# Patient Record
Sex: Female | Born: 1980 | Race: Black or African American | Hispanic: No | Marital: Married | Smoking: Never smoker
Health system: Southern US, Community
[De-identification: ages and names within clinical notes are randomized; demographics above are authoritative.]

## PROBLEM LIST (undated history)

## (undated) HISTORY — PX: OTHER SURGICAL HISTORY: SHX169

---

## 2000-07-05 ENCOUNTER — Emergency Department (HOSPITAL_COMMUNITY): Admission: EM | Admit: 2000-07-05 | Discharge: 2000-07-05 | Payer: Self-pay | Admitting: Emergency Medicine

## 2000-07-05 ENCOUNTER — Encounter: Payer: Self-pay | Admitting: *Deleted

## 2000-08-10 ENCOUNTER — Inpatient Hospital Stay (HOSPITAL_COMMUNITY): Admission: AD | Admit: 2000-08-10 | Discharge: 2000-08-10 | Payer: Self-pay | Admitting: *Deleted

## 2000-09-01 ENCOUNTER — Inpatient Hospital Stay (HOSPITAL_COMMUNITY): Admission: AD | Admit: 2000-09-01 | Discharge: 2000-09-01 | Payer: Self-pay | Admitting: *Deleted

## 2000-09-10 ENCOUNTER — Encounter: Admission: RE | Admit: 2000-09-10 | Discharge: 2000-09-10 | Payer: Self-pay | Admitting: Family Medicine

## 2000-09-21 ENCOUNTER — Ambulatory Visit (HOSPITAL_COMMUNITY): Admission: RE | Admit: 2000-09-21 | Discharge: 2000-09-21 | Payer: Self-pay

## 2000-09-30 ENCOUNTER — Inpatient Hospital Stay (HOSPITAL_COMMUNITY): Admission: AD | Admit: 2000-09-30 | Discharge: 2000-09-30 | Payer: Self-pay | Admitting: Obstetrics

## 2000-10-04 ENCOUNTER — Encounter: Admission: RE | Admit: 2000-10-04 | Discharge: 2000-10-04 | Payer: Self-pay | Admitting: Family Medicine

## 2000-10-08 ENCOUNTER — Encounter: Admission: RE | Admit: 2000-10-08 | Discharge: 2000-10-08 | Payer: Self-pay | Admitting: Family Medicine

## 2000-10-10 ENCOUNTER — Encounter: Admission: RE | Admit: 2000-10-10 | Discharge: 2000-10-10 | Payer: Self-pay | Admitting: Family Medicine

## 2000-10-17 ENCOUNTER — Encounter: Admission: RE | Admit: 2000-10-17 | Discharge: 2000-10-17 | Payer: Self-pay | Admitting: Family Medicine

## 2000-10-18 ENCOUNTER — Encounter: Admission: RE | Admit: 2000-10-18 | Discharge: 2000-10-18 | Payer: Self-pay | Admitting: Family Medicine

## 2000-11-12 ENCOUNTER — Encounter: Admission: RE | Admit: 2000-11-12 | Discharge: 2000-11-12 | Payer: Self-pay | Admitting: Family Medicine

## 2000-11-27 ENCOUNTER — Encounter: Admission: RE | Admit: 2000-11-27 | Discharge: 2000-11-27 | Payer: Self-pay | Admitting: Sports Medicine

## 2000-11-29 ENCOUNTER — Inpatient Hospital Stay (HOSPITAL_COMMUNITY): Admission: AD | Admit: 2000-11-29 | Discharge: 2000-12-06 | Payer: Self-pay | Admitting: Obstetrics & Gynecology

## 2000-11-29 ENCOUNTER — Encounter: Payer: Self-pay | Admitting: Obstetrics & Gynecology

## 2000-11-30 ENCOUNTER — Encounter: Payer: Self-pay | Admitting: Obstetrics & Gynecology

## 2000-12-02 ENCOUNTER — Encounter: Payer: Self-pay | Admitting: Obstetrics & Gynecology

## 2000-12-13 ENCOUNTER — Encounter: Admission: RE | Admit: 2000-12-13 | Discharge: 2000-12-13 | Payer: Self-pay | Admitting: Family Medicine

## 2000-12-31 ENCOUNTER — Encounter: Admission: RE | Admit: 2000-12-31 | Discharge: 2000-12-31 | Payer: Self-pay | Admitting: Family Medicine

## 2001-01-06 ENCOUNTER — Inpatient Hospital Stay (HOSPITAL_COMMUNITY): Admission: AD | Admit: 2001-01-06 | Discharge: 2001-01-08 | Payer: Self-pay | Admitting: *Deleted

## 2001-01-10 ENCOUNTER — Encounter: Admission: RE | Admit: 2001-01-10 | Discharge: 2001-01-10 | Payer: Self-pay | Admitting: Family Medicine

## 2001-02-19 ENCOUNTER — Encounter: Admission: RE | Admit: 2001-02-19 | Discharge: 2001-02-19 | Payer: Self-pay | Admitting: Family Medicine

## 2001-02-19 ENCOUNTER — Other Ambulatory Visit: Admission: RE | Admit: 2001-02-19 | Discharge: 2001-02-19 | Payer: Self-pay | Admitting: Family Medicine

## 2002-07-24 ENCOUNTER — Encounter: Admission: RE | Admit: 2002-07-24 | Discharge: 2002-07-24 | Payer: Self-pay | Admitting: Family Medicine

## 2002-12-17 ENCOUNTER — Emergency Department (HOSPITAL_COMMUNITY): Admission: EM | Admit: 2002-12-17 | Discharge: 2002-12-17 | Payer: Self-pay | Admitting: Emergency Medicine

## 2002-12-19 ENCOUNTER — Encounter: Admission: RE | Admit: 2002-12-19 | Discharge: 2002-12-19 | Payer: Self-pay | Admitting: Family Medicine

## 2002-12-30 ENCOUNTER — Encounter: Admission: RE | Admit: 2002-12-30 | Discharge: 2002-12-30 | Payer: Self-pay | Admitting: Sports Medicine

## 2003-03-12 ENCOUNTER — Emergency Department (HOSPITAL_COMMUNITY): Admission: EM | Admit: 2003-03-12 | Discharge: 2003-03-13 | Payer: Self-pay | Admitting: *Deleted

## 2003-04-08 ENCOUNTER — Encounter (INDEPENDENT_AMBULATORY_CARE_PROVIDER_SITE_OTHER): Payer: Self-pay | Admitting: *Deleted

## 2003-04-08 LAB — CONVERTED CEMR LAB

## 2003-04-16 ENCOUNTER — Encounter: Admission: RE | Admit: 2003-04-16 | Discharge: 2003-04-16 | Payer: Self-pay | Admitting: Family Medicine

## 2003-06-26 ENCOUNTER — Encounter: Admission: RE | Admit: 2003-06-26 | Discharge: 2003-06-26 | Payer: Self-pay | Admitting: Family Medicine

## 2003-09-04 ENCOUNTER — Encounter (INDEPENDENT_AMBULATORY_CARE_PROVIDER_SITE_OTHER): Payer: Self-pay | Admitting: Specialist

## 2003-09-04 ENCOUNTER — Ambulatory Visit (HOSPITAL_COMMUNITY): Admission: RE | Admit: 2003-09-04 | Discharge: 2003-09-04 | Payer: Self-pay | Admitting: Obstetrics and Gynecology

## 2004-04-05 ENCOUNTER — Emergency Department (HOSPITAL_COMMUNITY): Admission: EM | Admit: 2004-04-05 | Discharge: 2004-04-05 | Payer: Self-pay | Admitting: Emergency Medicine

## 2005-05-16 ENCOUNTER — Ambulatory Visit: Payer: Self-pay | Admitting: Internal Medicine

## 2005-06-16 ENCOUNTER — Ambulatory Visit: Payer: Self-pay | Admitting: Internal Medicine

## 2005-06-30 ENCOUNTER — Encounter: Payer: Self-pay | Admitting: Family Medicine

## 2005-06-30 ENCOUNTER — Ambulatory Visit: Payer: Self-pay | Admitting: Family Medicine

## 2005-08-07 ENCOUNTER — Ambulatory Visit: Payer: Self-pay | Admitting: Family Medicine

## 2005-08-11 ENCOUNTER — Ambulatory Visit: Payer: Self-pay | Admitting: *Deleted

## 2005-10-31 ENCOUNTER — Ambulatory Visit: Payer: Self-pay | Admitting: Family Medicine

## 2005-11-07 ENCOUNTER — Ambulatory Visit (HOSPITAL_COMMUNITY): Admission: RE | Admit: 2005-11-07 | Discharge: 2005-11-07 | Payer: Self-pay | Admitting: Family Medicine

## 2005-12-21 ENCOUNTER — Emergency Department (HOSPITAL_COMMUNITY): Admission: EM | Admit: 2005-12-21 | Discharge: 2005-12-21 | Payer: Self-pay | Admitting: Emergency Medicine

## 2006-07-06 ENCOUNTER — Encounter (INDEPENDENT_AMBULATORY_CARE_PROVIDER_SITE_OTHER): Payer: Self-pay | Admitting: *Deleted

## 2006-08-23 ENCOUNTER — Ambulatory Visit (HOSPITAL_COMMUNITY): Admission: RE | Admit: 2006-08-23 | Discharge: 2006-08-23 | Payer: Self-pay | Admitting: Obstetrics and Gynecology

## 2006-12-20 ENCOUNTER — Emergency Department (HOSPITAL_COMMUNITY): Admission: EM | Admit: 2006-12-20 | Discharge: 2006-12-20 | Payer: Self-pay | Admitting: Emergency Medicine

## 2007-03-28 ENCOUNTER — Emergency Department (HOSPITAL_COMMUNITY): Admission: EM | Admit: 2007-03-28 | Discharge: 2007-03-28 | Payer: Self-pay | Admitting: Emergency Medicine

## 2007-11-17 ENCOUNTER — Inpatient Hospital Stay (HOSPITAL_COMMUNITY): Admission: AD | Admit: 2007-11-17 | Discharge: 2007-11-20 | Payer: Self-pay | Admitting: Obstetrics & Gynecology

## 2010-05-29 ENCOUNTER — Encounter: Payer: Self-pay | Admitting: Family Medicine

## 2010-05-29 ENCOUNTER — Encounter: Payer: Self-pay | Admitting: Obstetrics and Gynecology

## 2010-09-20 NOTE — Op Note (Signed)
April Roberts, April Roberts             ACCOUNT NO.:  [id number]   MEDICAL RECORD NO.:  [id number]          PATIENT TYPE:  INP   LOCATION:  9123                          FACILITY:  [facility]   PHYSICIAN:  Gerrit Friends. Aldona Bar, M.D.   DATE OF BIRTH:  [birth year]   DATE OF PROCEDURE:  DATE OF DISCHARGE:                               OPERATIVE REPORT   PREOPERATIVE DIAGNOSES:  1. Term pregnancy.  2. Vaginal birth after cesarean attempt, failed vaginal birth after      cesarean attempt.   POSTOPERATIVE DIAGNOSES:  1. Term pregnancy.  2. Vaginal birth after cesarean attempt, failed vaginal birth after      cesarean attempt plus delivery of 8 pounds 3 ounces female infant,      Apgars 8 and 9.   PROCEDURE:  Low-transverse cesarean section.   SURGEON:  Gerrit Friends. Aldona Bar, MD   ANESTHESIA:  Augmented epidural.   HISTORY:  This is a 30 year old gravida 4, para 2 who delivered of first  child by cesarean section because of failure to progress (8 pounds 5  ounces in 1999) and subsequently had a VBAC with a second delivery in  2002 and delivered a 6 pounds 5 ounces baby.  She had been followed the  office and was desirous of the VBAC.  Apparently, 1 week ago in the  office an ultrasound was done, I assumed because of size greater than  dates and the patient told me that the ultrasounds at the baby weighed  about 7 pounds.   She presented at 5 cm dilatation with a vertex at -2 station on the  morning of November 17, 2007, in early active labor.  She underwent  amniotomy with production of clear fluid.  An IUPC was placed and  because of poor labor.  She was augmented with Pitocin.  It is  noteworthy she measured 43 cm upon admission.   The patient progressed slowly but steadily to full dilatation, but after  pushing for a good hour, the vertex was at best at +2 station.  The  fetal heart rate remained reassuring.  Because of the concern about the  size of the baby in spite of what the ultrasounds stated in the  office.  I was somewhat reluctant assume to try a vacuum extraction assisted  delivery and after discussion with the patient and husband decision was  made to proceed to cesarean section for delivery.  The essential  diagnosis at this time was arrest disorder of the second stage of labor.   PROCEDURE:  Once in the operating room, the epidural was augmented.  After the patient was appropriately draped having been prepped and good  anesthetic levels were documented procedure was begun.  A Pfannenstiel  incision was made and with minimal difficulty dissected down sharply to  and through the fascia in a low transverse fashion with hemostasis  created each layer.  Subfascial space was created inferiorly and  superiorly, muscles separated in the midline.  Peritoneum was identified  and appropriate care taken to avoid the bowel superiorly and the bladder  inferiorly.  At this time, the vesicouterine  peritoneum was incised in a  low-transverse fashion and pushed off the lower uterine segment with  ease.  Sharp incisions into the lower segment with the Metzenbaum  scissors was then carried out in low-transverse fashion and extended  laterally with the fingers.  At this time, the vertex as one could  imagine was well into the pelvis.  But with persistence, it was possible  to elevate the vertex.  Thereafter, using a vacuum extractor delivery  was effected from vertex position.  The infant cried shortly after  delivery and after cord was clamped and cut, the infant was passed off  to the team.  Apgars were noted to be 8 and 9 and subsequent weight was  found to be 8 pounds 3 ounces - infant was female.   The infant was subsequently taken to nursery in good condition.  At this  time, placenta had been delivered and intact after cord bloods were  collected.  Uterus was exteriorized rendered free of any remaining  products conception and good uterine contractility was afforded with  slowly given  intravenous Pitocin and manual stimulation.  This time  closure of the uterine incision was carried out using a single layer of  #1 Vicryl in a running locking fashion.  This was oversewn with several  figure-of-eight #1 Vicryls.  Hemostasis was adequate.  The bladder flap  was reapproximated with 3-0 Vicryl in a running fashion.  At this time,  the uterine incision was noted to be hemostatic.  Tubes and ovaries were  inspected noted to be normal.  With all counts being correct and no  foreign bodies noted to be remaining in the abdominal cavity.  The  abdomen lavaged of all free blood and clot and uterus was replaced back  into the abdominal cavity.  At this time, closure of the abdomen was  carried out in layers.  The abdominal peritoneum was closed with 0  Vicryl in a running fashion and muscle secured with same.  Assured of  good fascial hemostasis, the fascia was then reapproximated using 0  Vicryl from angle to midline bilaterally.  Subcutaneous tissues were  rendered hemostatic and staples were then used to close the skin.  Sterile pressure dressing was applied.   At this time, the patient was transported to recovery room in  satisfactory condition, having tolerated well.  Estimated blood loss 500  mL.  All counts were correct x2.  The urine which did appear somewhat  bloody shortly after delivery, probably secondary to the elevation out  of the pelvis of the vertex and cleared prior to the procedure being  over.  At the conclusion of the procedure, both mother and baby were  doing well in the respective recovery areas.  All counts were correct  x2.      Gerrit Friends. Aldona Bar, M.D.  Electronically Signed     RMW/MEDQ  D:  11/17/2007  T:  11/17/2007  Job:  [id number]

## 2010-09-23 NOTE — H&P (Signed)
NAME:  April Roberts, ABTS                      ACCOUNT NO.:  [id number]   MEDICAL RECORD NO.:  [id number]                   PATIENT TYPE:  AMB   LOCATION:  SDC                                  [facility]:  [facility]   PHYSICIAN:  Naima A. Dillard, M.D.              DATE OF BIRTH:  04-11-1981   DATE OF ADMISSION:  DATE OF DISCHARGE:                                HISTORY & PHYSICAL   CHIEF COMPLAINT:  Right Bartholin's cyst and enlarged right labia minora.   HISTORY OF PRESENT ILLNESS:  The patient is a 30 year old African-American  gravida 3, para 2 who presented to me on August 21, 2003 complaining of a  cyst on her right labia, which was diagnosed as a Bartholin's. It has been  bothering her for 11 years. This cyst has been drained more than 20 times.  The patient reports that the cyst is very painful and interferes with  intercourse. She has never had a marsupialization or attempt at removal of  the Bartholin's gland and patient desires to have marsupialization and  revision of the right labia minora, which was also enlarged.   PAST MEDICAL HISTORY:  Unremarkable.   PAST OBSTETRIC HISTORY:  Significant for a cesarean section x1 and a VBAC  x1.   MEDICATIONS:  None.   ALLERGIES:  No known drug allergies.   PAST SURGICAL HISTORY:  Significant for cesarean section secondary to  failure to progress and wisdom teeth to be removed.   PAST GYNECOLOGIC HISTORY:  The patient had menarche at age 10, occurring once  a month, lasting for 5 days. She is currently using the [place] for birth  control. Denies any history of an abnormal pap smear or sexually transmitted  diseases.   SOCIAL HISTORY:  Negative for alcohol, tobacco, or illicit drug use.   REVIEW OF SYSTEMS:  ENDOCRINE:  Unremarkable.  GENITOURINARY:  As above.  CARDIOVASCULAR:  Unremarkable.  RESPIRATORY:  Unremarkable.  GASTROINTESTINAL:  Unremarkable.  MUSCULOSKELETAL:  Unremarkable.   PHYSICAL EXAMINATION:  VITAL SIGNS:  Weight  191 pounds. Blood pressure  110/80.  HEENT:  Pupils are equal. Hearing is normal.  NECK:  Throat is clear. Thyroid is not enlarged.  HEART:  Regular rate and rhythm.  LUNGS:  Clear to auscultation bilaterally.  BREASTS:  No masses, discharge, skin changes, or nipple retraction.  BACK:  No CVA tenderness.  ABDOMEN:  Nontender without any masses or organomegaly.  EXTREMITIES:  No clubbing, cyanosis, or edema bilaterally.  NEUROLOGIC:  Examination is within normal limits.  PELVIC:  Vaginal examination is within normal limits. On vulvar examination,  the patient has a right large Bartholin's cyst and a right labia minora  cyst. No fluctuance with mild discomfort. Cervix is normal size, shape, and  consistency. The IUD string is visible. Adnexa has no masses, and nontender.  RECTO/VAGINAL:  Examination was deferred.   ASSESSMENT:  Right Bartholin's cyst with tracks into the right labia minora  causing hypertrophic labia minora.   PLAN:  The patient has had multiple drainage of the cyst and desires  marsupialization and removal of fistulous tracks of the right labia minora  with a right labia minora revision. The patient was told that she had the  options of observation and doing nothing, versus marsupialization with  removal of fistulous track and right labia minora revision versus evaluation  and second opinion at [facility]. The patient desires surgery. The risks  are, but not limited to, bleeding, infection, future dyspareunia, poor  healing, and a need for second surgery. The patient was told that this may  not cure the problem and that the problem could return and she may need more  surgery. The patient agrees with the plan of care and desires to proceed  with surgery.                                               Naima A. Normand Sloop, M.D.    NAD/MEDQ  D:  09/03/2003  T:  09/03/2003  Job:  [id number]

## 2010-09-23 NOTE — Discharge Summary (Signed)
[facility]  Patient:    April Roberts, April Roberts                   MRN: [id number] Adm. Date:  [id number] Disc. Date: 12/06/00 Attending:  Antionette Char Dictator:   Emelda Fear, M.D.                           Discharge Summary  DATE OF BIRTH:                May 25, 1980  ADMISSION DIAGNOSES:          Pyelonephritis.  DISCHARGE DIAGNOSES:          Pyelonephritis.  HOSPITAL COURSE:              Claudetta is a 30 year old female G2, P1-0-0-1 presenting at 80 4/7 weeks to the [facility] secondary to fevers, and chills.  Upon presentation patient noted that she had experienced dysuria and hematuria in addition to the fever and chills.  Urinalysis upon presentation revealed trace leukocyte esterase negative for urine nitrites, 7-10 wbcs with many bacteria present.  Urine culture was positive for Enterococcus species and group B strep.  Enterococcus species was sensitive to ampicillin so patient received a seven day course of ampicillin IV while in-house.  White count on admission was 10.7, hemoglobin 11.6, hematocrit 32.7, platelet count 189,000, neutrophils 93%, lymphocytes 4%.  Blood culture was negative. Patient also received a renal ultrasound during admission that demonstrated minimal to moderate hydronephrosis on the left, however, within normal limits for the third trimester, and moderate hydronephrosis on the right kidney. Chest x-ray during admission was within normal limits and no active disease was detected.  Obstetrical ultrasound was also obtained during admission which revealed a single living intrauterine fetus in cephalic presentation with appropriate interval growth and amniotic fluid.  Patient was also managed while in-house on a pain management regimen with Darvocet and Percocet p.r.n. On the day of admission patient was also encouraged for an HIV test which was ordered and currently pending.  Upon day of discharge patient was  denying any costovertebral angle tenderness at this time and was tolerating p.o. intake. Patient was also afebrile on the day of discharge.  CONDITION ON DISCHARGE:       Stable.  DISPOSITION:                  Patient is discharged home.  DISCHARGE MEDICATIONS:        Amoxicillin 500 mg p.o. t.i.d.  ACTIVITY:                     Patient can resume normal activity.  DIET:                         Patient can resume normal diet.  WOUND CARE:                   Not applicable.  SPECIAL INSTRUCTIONS:         Patient is recommended to call Dr. ______, her primary care physician, with the development of fever, recurrence of costovertebral angle tenderness, or dysuria.  Patient will be following up at the [facility] on Thursday, August 8 at 1:30. DD:  12/06/00 TD:  12/07/00 Job: [id number]

## 2010-09-23 NOTE — Discharge Summary (Signed)
April Roberts, FAIR NO.:  [id number]   MEDICAL RECORD NO.:  [id number]          PATIENT TYPE:  INP   LOCATION:  9123                          FACILITY:  [facility]   PHYSICIAN:  Malva Limes, M.D.    DATE OF BIRTH:  Nov 22, 1980   DATE OF ADMISSION:  11/17/2007  DATE OF DISCHARGE:  11/20/2007                               DISCHARGE SUMMARY   FINAL DIAGNOSES:  Intrauterine pregnancy at term, trial of vaginal birth  after cesarean, failed attempt.   PROCEDURE:  Repeat low transverse cesarean section.   SURGEON:  Gerrit Friends. Aldona Bar, MD.   COMPLICATIONS:  None.   This 30 year old G4, P 2-0-1-2 presents at term desiring VBAC.  The  patient had deliver her first child by cesarean section in 1999  secondary to failure to progress and desires to try a VBAC with this  pregnancy.  The patient has been counseled in the office and has signed  consent forms as well.  An ultrasound was performed in the office about  a week prior showing an estimated fetal weight of about 7 pounds.  The  patient's antepartum course up to this point had been uncomplicated.  She was treated like a gestational diabetic because she was unable to  tolerate her Glucola sugar test, 1 hour screen, and did have a history  of gestational diabetes with her last pregnancy.  She continued to  monitor her blood sugars which were all within normal limits.  She did  have a negative group B strep culture at 35 weeks.  The patient was  admitted to the hospital on November 17, 2007, in active labor.  Amniotomy  was performed with clear fluids and Pitocin was begun to augment her  labor.  The patient dilated to complete and complete, but after pushing  for about a good hour, the vertex was still at +2 station.  Discussion  was held with the patient at this point and a decision was made to  proceed with the cesarean section at this time secondary to an arrest  disorder at the second stage of labor.  The patient was taken to  the  operating room by Dr. Annamaria Helling on November 17, 2007, where a repeat low  transverse cesarean section was performed with the delivery of an 8  pounds 3 ounces female infant with Apgars of 8 and 9.  Delivery went  without complications.  The patient's postoperative course was benign  without any significant fevers.  The patient was felt ready for  discharge on postoperative day #3.  She was sent home on a regular diet,  told to decrease activities, told to continue her prenatal vitamins, was  given Percocet 1 to 2 every 4 to 6 hours as needed for her pain, told  she could use an over-the-counter stool softener was to follow up in our  office in 4 weeks.  Instructions and precautions were reviewed with the  patient.  Labs on discharge, the patient had a hemoglobin of 11.6, white  blood cell count of 12.7, and platelets of 188,000.      April Roberts,  P.A.-C.      ______________________________  Malva Limes, M.D.   MB/MEDQ  D:  12/17/2007  T:  12/18/2007  Job:  [id number]

## 2010-09-23 NOTE — Op Note (Signed)
NAME:  April Roberts, April Roberts                      ACCOUNT NO.:  [id number]   MEDICAL RECORD NO.:  [id number]                   PATIENT TYPE:  AMB   LOCATION:  SDC                                  [facility]   PHYSICIAN:  Naima A. Dillard, M.D.              DATE OF BIRTH:  1981/03/16   DATE OF PROCEDURE:  09/04/2003  DATE OF DISCHARGE:                                 OPERATIVE REPORT   PREOPERATIVE DIAGNOSIS:  Right Bartholin's cyst and right labia minora cyst.   POSTOPERATIVE DIAGNOSIS:  Right Bartholin's cyst and right labia minora  cyst.   PROCEDURE:  Marsupialization of right Bartholin's and right labia minora  cyst with right labium minorum revision.   SURGEON:  Naima A. Normand Sloop, M.D.   ANESTHESIA:  General laryngeal mask airway.   ESTIMATED BLOOD LOSS:  About 300 mL.   URINE OUTPUT:  100 mL clear urine.   COMPLICATIONS:  None.   FINDINGS:  Right Bartholin's gland cyst and about a 1 cm right labia minora  cyst and hypertrophic right labium minorum.  The patient went to the  recovery room in stable condition.   DESCRIPTION OF PROCEDURE:  The patient was taken to the operating room where  she was given general anesthesia with laryngeal mask airway and placed in  the dorsal lithotomy position, prepped and draped in the normal sterile  fashion.  The right labium minorum was grasped with Allis clamps and 10 mL  1% lidocaine was placed in this area of the Bartholin's cyst gland.  Vertical incision was made with a knife just over the right Bartholin's cyst  and this area was marsupialized using 3-0 chromic.  Attention was then  turned to the right labium minorum.  A marking pen was used to mark the area  of the right labia minora that was going to be removed.  The knife was then  used to remove the hypertrophic excess right labia minora with the cyst.  There was no track between the right Bartholin's cyst and the right minora  cyst, however, we did see a portion of the cyst  wall left behind in the  right labia minora, so the right labia minora was made hemostatic with 3-0  figure-of-eight stitches and the cyst wall that was noted was also  marsupialized.  Hemostasis was noted.  A piece of Gelfoam and 4 inch packing  was placed into the marsupialization of the Bartholin's cyst.  Hemostasis  was noted.  Sponge, lap and needle counts correct x 2.  The patient taken to  the recovery room in stable condition.                                               Naima A. Normand Sloop, M.D.    NAD/MEDQ  D:  09/04/2003  T:  09/04/2003  Job:  [id number]

## 2011-02-02 LAB — CBC
HCT: 38.4
Hemoglobin: 13.1
MCV: 90.5
MCV: 92.2
Platelets: 229
RBC: 3.69 — ABNORMAL LOW
WBC: 12.7 — ABNORMAL HIGH
WBC: 5.9

## 2011-02-14 LAB — GC/CHLAMYDIA PROBE AMP, GENITAL: Chlamydia, DNA Probe: NEGATIVE

## 2011-02-14 LAB — CBC
HCT: 34.9 — ABNORMAL LOW
Hemoglobin: 12.1
MCHC: 34.7
MCV: 89
Platelets: 277
RBC: 3.92
RDW: 13.1
WBC: 5

## 2011-02-14 LAB — URINALYSIS, ROUTINE W REFLEX MICROSCOPIC
Bilirubin Urine: NEGATIVE
Glucose, UA: NEGATIVE
Hgb urine dipstick: NEGATIVE
Ketones, ur: NEGATIVE
Nitrite: POSITIVE — AB
Protein, ur: NEGATIVE
Specific Gravity, Urine: 1.019
Urobilinogen, UA: 1
pH: 8

## 2011-02-14 LAB — SYPHILIS: RPR W/REFLEX TO RPR TITER AND TREPONEMAL ANTIBODIES, TRADITIONAL SCREENING AND DIAGNOSIS ALGORITHM: RPR Ser Ql: NONREACTIVE

## 2011-02-14 LAB — DIFFERENTIAL
Basophils Absolute: 0
Basophils Relative: 1
Eosinophils Absolute: 0.1 — ABNORMAL LOW
Eosinophils Relative: 1
Lymphocytes Relative: 25
Lymphs Abs: 1.2
Monocytes Absolute: 0.3
Monocytes Relative: 6
Neutro Abs: 3.4
Neutrophils Relative %: 67

## 2011-02-14 LAB — WET PREP, GENITAL
Trich, Wet Prep: NONE SEEN
Yeast Wet Prep HPF POC: NONE SEEN

## 2011-02-14 LAB — URINE MICROSCOPIC-ADD ON

## 2011-02-14 LAB — ABO/RH: ABO/RH(D): O POS

## 2011-02-14 LAB — HCG, QUANTITATIVE, PREGNANCY

## 2011-02-20 LAB — URINALYSIS, ROUTINE W REFLEX MICROSCOPIC
Nitrite: POSITIVE — AB
Protein, ur: 100 — AB
pH: 6

## 2011-02-20 LAB — URINE CULTURE

## 2011-02-20 LAB — URINE MICROSCOPIC-ADD ON

## 2011-12-07 ENCOUNTER — Emergency Department (HOSPITAL_COMMUNITY): Payer: Managed Care, Other (non HMO)

## 2011-12-07 ENCOUNTER — Emergency Department (HOSPITAL_COMMUNITY)
Admission: EM | Admit: 2011-12-07 | Discharge: 2011-12-07 | Disposition: A | Discharge status: Home / Self Care | Payer: Managed Care, Other (non HMO) | Attending: Emergency Medicine | Admitting: Emergency Medicine

## 2011-12-07 ENCOUNTER — Encounter (HOSPITAL_COMMUNITY): Payer: Self-pay

## 2011-12-07 DIAGNOSIS — R079 Chest pain, unspecified: Secondary | ICD-10-CM | POA: Insufficient documentation

## 2011-12-07 DIAGNOSIS — R799 Abnormal finding of blood chemistry, unspecified: Secondary | ICD-10-CM | POA: Insufficient documentation

## 2011-12-07 LAB — CBC
Hemoglobin: 12.2 g/dL (ref 12.0–15.0)
MCH: 30.6 pg (ref 26.0–34.0)
MCHC: 34.6 g/dL (ref 30.0–36.0)
MCV: 88.5 fL (ref 78.0–100.0)
RBC: 3.99 MIL/uL (ref 3.87–5.11)

## 2011-12-07 LAB — BASIC METABOLIC PANEL
CO2: 26 mEq/L (ref 19–32)
Calcium: 8.9 mg/dL (ref 8.4–10.5)
Creatinine, Ser: 0.87 mg/dL (ref 0.50–1.10)
GFR calc non Af Amer: 87 mL/min — ABNORMAL LOW (ref >90)
Glucose, Bld: 91 mg/dL (ref 70–99)

## 2011-12-07 LAB — URINALYSIS, ROUTINE W REFLEX MICROSCOPIC
Bilirubin Urine: NEGATIVE
Glucose, UA: NEGATIVE mg/dL
Hgb urine dipstick: NEGATIVE
Specific Gravity, Urine: 1.04 — ABNORMAL HIGH (ref 1.005–1.030)
pH: 7 (ref 5.0–8.0)

## 2011-12-07 LAB — POCT PREGNANCY, URINE: Preg Test, Ur: NEGATIVE

## 2011-12-07 MED ORDER — IOHEXOL 350 MG/ML SOLN
65.0000 mL | Freq: Once | INTRAVENOUS | Status: AC | PRN
  Administered 2011-12-07: 65 mL via INTRAVENOUS

## 2011-12-07 MED ORDER — SODIUM CHLORIDE 0.9 % IV SOLN: Freq: Once | INTRAVENOUS | Status: DC

## 2011-12-07 NOTE — ED Notes (Signed)
C/o sudden onset 1030 while at rest midsternal CP non radiating & SOB which lasted 20 min. Upon subsiding pain turned into pressure/heaviness across upper chest. Denies fever, cold, cough, n/v, diaphoresis.  Pain increases with palpation & deep breaths

## 2011-12-07 NOTE — ED Provider Notes (Cosign Needed)
History     CSN: [id number]  Arrival date & time 12/07/11  1144   First MD Initiated Contact with Patient 12/07/11 1233      Chief Complaint  Patient presents with  . Chest Pain    (Consider location/radiation/quality/duration/timing/severity/associated sxs/prior treatment) HPI Comments: There is a 31 year old woman who had acute onset of chest pain at 10:30 AM. It felt like a stomach cramp in her chest. The pain lasted 15 or 20 minutes. The pain did not radiate. She took no medications for it. She had no prior similar episode. She therefore sought evaluation.  Patient is a 31 y.o. female presenting with chest pain. The history is provided by the patient. No language interpreter was used.  Chest Pain The chest pain began 3 - 5 hours ago. Duration of episode(s) is 20 minutes. Episode frequency: One episode. The chest pain is resolved. Associated with: Nothing. At its most intense, the pain is at 4/10. The pain is currently at 0/10. The severity of the pain is moderate. Quality: Cramping. The pain does not radiate. Exacerbated by: Nothing. Pertinent negatives for primary symptoms include no fever. Associated symptoms comments: No recent immobilization, no history of malignancy, no recent travel.  Not on birth control pills.. She tried nothing for the symptoms.     History reviewed. No pertinent past medical history.  Past Surgical History  Procedure Date  . Labial cyst     No family history on file.  History  Substance Use Topics  . Smoking status: Never Smoker   . Smokeless tobacco: Not on file  . Alcohol Use: No    OB History    Grav Para Term Preterm Abortions TAB SAB Ect Mult Living                  Review of Systems  Constitutional: Negative.  Negative for fever and chills.  HENT: Negative.   Eyes: Negative.   Respiratory: Negative.   Cardiovascular: Positive for chest pain.  Genitourinary:       Her last period lasted 8 days, long for her.  It finished 2 days  ago.  Musculoskeletal: Negative.   Skin: Negative.   Neurological: Negative.   Psychiatric/Behavioral: Negative.     Allergies  Review of patient's allergies indicates no known allergies.  Home Medications  No current outpatient prescriptions on file.  BP 115/76  Pulse 60  Temp 98.3 F (36.8 C) (Oral)  Resp 16  Ht 5\' 3"  (1.6 m)  Wt 204 lb (92.534 kg)  BMI 36.14 kg/m2  SpO2 100%  LMP 12/05/2011  Physical Exam  Nursing note and vitals reviewed. Constitutional: She is oriented to person, place, and time. She appears well-developed and well-nourished. No distress.  HENT:  Head: Normocephalic and atraumatic.  Right Ear: External ear normal.  Left Ear: External ear normal.  Mouth/Throat: Oropharynx is clear and moist.  Eyes: Conjunctivae and EOM are normal. Pupils are equal, round, and reactive to light. No scleral icterus.  Neck: Normal range of motion. Neck supple.  Cardiovascular: Normal rate, regular rhythm and normal heart sounds.   Pulmonary/Chest: Effort normal and breath sounds normal. She exhibits no tenderness.  Abdominal: Soft. Bowel sounds are normal.  Musculoskeletal: Normal range of motion. She exhibits no edema and no tenderness.  Lymphadenopathy:    She has no cervical adenopathy.  Neurological: She is alert and oriented to person, place, and time.       No sensory or motor deficit.  Skin: Skin is warm and  dry.  Psychiatric: She has a normal mood and affect. Her behavior is normal.    ED Course  Procedures (including critical care time)  Labs Reviewed  CBC - Abnormal; Notable for the following:    WBC 3.6 (*)     HCT 35.3 (*)     All other components within normal limits  BASIC METABOLIC PANEL  D-DIMER, QUANTITATIVE   12:34 PM  Date: 12/07/2011  Rate: 70  Rhythm: normal sinus rhythm  QRS Axis: normal  Intervals: normal  ST/T Wave abnormalities: normal  Conduction Disutrbances:none  Narrative Interpretation: Normal EKG  Old EKG Reviewed: none  available  1:44 PM . Results for orders placed during the hospital encounter of 12/07/11  CBC      Component Value Range   WBC 3.6 (*) 4.0 - 10.5 K/uL   RBC 3.99  3.87 - 5.11 MIL/uL   Hemoglobin 12.2  12.0 - 15.0 g/dL   HCT 16.1 (*) 09.6 - 04.5 %   MCV 88.5  78.0 - 100.0 fL   MCH 30.6  26.0 - 34.0 pg   MCHC 34.6  30.0 - 36.0 g/dL   RDW 40.9  81.1 - 91.4 %   Platelets 234  150 - 400 K/uL  BASIC METABOLIC PANEL      Component Value Range   Sodium 137  135 - 145 mEq/L   Potassium 4.4  3.5 - 5.1 mEq/L   Chloride 101  96 - 112 mEq/L   CO2 26  19 - 32 mEq/L   Glucose, Bld 91  70 - 99 mg/dL   BUN 10  6 - 23 mg/dL   Creatinine, Ser 7.82  0.50 - 1.10 mg/dL   Calcium 8.9  8.4 - 95.6 mg/dL   GFR calc non Af Amer 87 (*) >90 mL/min   GFR calc Af Amer >90  >90 mL/min  D-DIMER, QUANTITATIVE      Component Value Range   D-Dimer, Quant 1.62 (*) 0.00 - 0.48 ug/mL-FEU  POCT I-STAT TROPONIN I      Component Value Range   Troponin i, poc 0.00  0.00 - 0.08 ng/mL   Comment 3            Dg Chest 2 View  12/07/2011  *RADIOLOGY REPORT*  Clinical Data: Chest pain  CHEST - 2 VIEW  Comparison:  None.  Findings:  The heart size and mediastinal contours are within normal limits.  Both lungs are clear.  The visualized skeletal structures are unremarkable.  IMPRESSION: No active cardiopulmonary disease.  Original Report Authenticated By: Judie Petit. Ruel Favors, M.D.    D-dimer elevated.  CT angio of chest ordered.  Other lab results reassuringly negative.   4:16 PM CT angio of chest negative.  Pt advised that all of her tests were normal.  No serious cause of pain was found.  Released.  Return if she has recurrence of pain, feels faint, or develops fever.  1. Nonspecific chest pain        Carleene Cooper III, MD 12/07/11 1620

## 2011-12-07 NOTE — ED Notes (Signed)
Patient transported to X-ray 

## 2011-12-07 NOTE — ED Notes (Signed)
Patient transported to CT 

## 2011-12-07 NOTE — ED Notes (Signed)
Pt presents with sudden onset of mid-sternal chest pain this morning that she described "like stomach cramps only in my chest".  Pt reports that cramping is resolved, now feels a tightness to chest that radiates through to mid-scapula.  +shortness of breath.

## 2013-03-28 ENCOUNTER — Other Ambulatory Visit (HOSPITAL_COMMUNITY)
Admission: RE | Admit: 2013-03-28 | Discharge: 2013-03-28 | Disposition: A | Discharge status: Home / Self Care | Payer: Managed Care, Other (non HMO) | Source: Ambulatory Visit | Attending: Family | Admitting: Family

## 2013-03-28 ENCOUNTER — Other Ambulatory Visit: Payer: Self-pay

## 2013-03-28 DIAGNOSIS — Z113 Encounter for screening for infections with a predominantly sexual mode of transmission: Secondary | ICD-10-CM | POA: Insufficient documentation

## 2013-03-28 DIAGNOSIS — Z0000 Encounter for general adult medical examination without abnormal findings: Secondary | ICD-10-CM | POA: Insufficient documentation

## 2015-01-24 ENCOUNTER — Encounter (HOSPITAL_COMMUNITY): Payer: Self-pay | Admitting: Emergency Medicine

## 2015-01-24 ENCOUNTER — Emergency Department (HOSPITAL_COMMUNITY)
Admission: EM | Admit: 2015-01-24 | Discharge: 2015-01-24 | Disposition: A | Discharge status: Home / Self Care | Payer: BLUE CROSS/BLUE SHIELD | Attending: Emergency Medicine | Admitting: Emergency Medicine

## 2015-01-24 DIAGNOSIS — K088 Other specified disorders of teeth and supporting structures: Secondary | ICD-10-CM | POA: Diagnosis not present

## 2015-01-24 DIAGNOSIS — K0381 Cracked tooth: Secondary | ICD-10-CM | POA: Insufficient documentation

## 2015-01-24 DIAGNOSIS — K0889 Other specified disorders of teeth and supporting structures: Secondary | ICD-10-CM

## 2015-01-24 MED ORDER — HYDROCODONE-ACETAMINOPHEN 5-325 MG PO TABS
1.0000 | ORAL_TABLET | Freq: Four times a day (QID) | ORAL | Status: DC | PRN
Start: 2015-01-24 — End: 2015-08-25

## 2015-01-24 MED ORDER — PENICILLIN V POTASSIUM 500 MG PO TABS: 500.0000 mg | ORAL_TABLET | Freq: Three times a day (TID) | ORAL | Status: DC

## 2015-01-24 MED ORDER — IBUPROFEN 800 MG PO TABS: 800.0000 mg | ORAL_TABLET | Freq: Three times a day (TID) | ORAL | Status: DC

## 2015-01-24 MED ORDER — HYDROCODONE-ACETAMINOPHEN 5-325 MG PO TABS
1.0000 | ORAL_TABLET | Freq: Once | ORAL | Status: AC
  Administered 2015-01-24: 1 via ORAL
  Filled 2015-01-24: qty 1

## 2015-01-24 NOTE — Discharge Instructions (Signed)

## 2015-01-24 NOTE — ED Notes (Signed)
Pt reports cavity to R lower molar causing pain to jaw and into ear. Denies fevers.

## 2015-01-24 NOTE — ED Provider Notes (Signed)
CSN: [id number]     Arrival date & time 01/24/15  2135 History   First MD Initiated Contact with Patient 01/24/15 2155     Chief Complaint  Patient presents with  . Dental Pain     (Consider location/radiation/quality/duration/timing/severity/associated sxs/prior Treatment) HPI   34 year old female presents for evaluation of dental pain. Patient report having right lower tooth pain ongoing for nearly 2 weeks. Pain described as a throbbing sensation, worsening with talking and chewing. Pain initially was mild and intermittent but for the past several days has been persistent. She was unable to be seen by a dentist. She has been taking over-the-counter medication including ibuprofen, Motrin, Tylenol p.m without adequate improvement. She denies fever, hearing changes, throat swelling, neck stiffness, or rash. She is a nonsmoker. She denies any recent trauma.  History reviewed. No pertinent past medical history. Past Surgical History  Procedure Laterality Date  . Labial cyst     No family history on file. Social History  Substance Use Topics  . Smoking status: Never Smoker   . Smokeless tobacco: None  . Alcohol Use: No   OB History    No data available     Review of Systems  Constitutional: Negative for fever.  HENT: Positive for dental problem.       Allergies  Review of patient's allergies indicates no known allergies.  Home Medications   Prior to Admission medications   Not on File   BP 133/85 mmHg  Pulse 71  Temp(Src) 98.2 F (36.8 C) (Oral)  Resp 16  Ht  (1.575 m)  SpO2 97%  LMP 01/10/2015 Physical Exam  Constitutional: She appears well-developed and well-nourished. No distress.  HENT:  Head: Atraumatic.  Right Ear: External ear normal.  Left Ear: External ear normal.  Mouth/Throat: Oropharynx is clear and moist.  Mouth: Tenderness to tooth #31 on palpation with mild surrounding gingival erythema. Tooth has a 30% old avulsed fracture.  No obvious  abscess noted.  Eyes: Conjunctivae are normal.  Neck: Neck supple.  Lymphadenopathy:    She has no cervical adenopathy.  Neurological: She is alert.  Skin: No rash noted.  Psychiatric: She has a normal mood and affect.  Nursing note and vitals reviewed.   ED Course  Procedures (including critical care time)  Dental pain, no obvious abscess. Will prescribe antibiotic and pain medication. Patient will follow-up with dentist in the next 2 days for further care.   MDM   Final diagnoses:  Pain, dental   BP 133/85 mmHg  Pulse 71  Temp(Src) 98.2 F (36.8 C) (Oral)  Resp 16  Ht  (1.575 m)  SpO2 97%  LMP 01/10/2015     Fayrene Helper, PA-C 01/24/15 2212  Richardean Canal, MD 01/25/15 [phone]

## 2015-08-25 ENCOUNTER — Emergency Department (HOSPITAL_COMMUNITY)
Admission: EM | Admit: 2015-08-25 | Discharge: 2015-08-25 | Disposition: A | Discharge status: Home / Self Care | Payer: BLUE CROSS/BLUE SHIELD | Attending: Emergency Medicine | Admitting: Emergency Medicine

## 2015-08-25 ENCOUNTER — Encounter (HOSPITAL_COMMUNITY): Payer: Self-pay | Admitting: Emergency Medicine

## 2015-08-25 DIAGNOSIS — K047 Periapical abscess without sinus: Secondary | ICD-10-CM | POA: Insufficient documentation

## 2015-08-25 DIAGNOSIS — K029 Dental caries, unspecified: Secondary | ICD-10-CM | POA: Insufficient documentation

## 2015-08-25 DIAGNOSIS — K002 Abnormalities of size and form of teeth: Secondary | ICD-10-CM | POA: Insufficient documentation

## 2015-08-25 DIAGNOSIS — Z791 Long term (current) use of non-steroidal anti-inflammatories (NSAID): Secondary | ICD-10-CM | POA: Insufficient documentation

## 2015-08-25 MED ORDER — PENICILLIN V POTASSIUM 250 MG PO TABS
500.0000 mg | ORAL_TABLET | Freq: Once | ORAL | Status: AC
  Administered 2015-08-25: 500 mg via ORAL
  Filled 2015-08-25: qty 2

## 2015-08-25 MED ORDER — HYDROCODONE-ACETAMINOPHEN 5-325 MG PO TABS: ORAL_TABLET | ORAL | Status: DC

## 2015-08-25 MED ORDER — PENICILLIN V POTASSIUM 500 MG PO TABS: 500.0000 mg | ORAL_TABLET | Freq: Three times a day (TID) | ORAL | Status: AC

## 2015-08-25 NOTE — ED Provider Notes (Signed)
CSN: [id number]     Arrival date & time 08/25/15  1718 History   First MD Initiated Contact with Patient 08/25/15 1808     Chief Complaint  Patient presents with  . Dental Pain     (Consider location/radiation/quality/duration/timing/severity/associated sxs/prior Treatment) HPI Comments: Patient presents with complaint of left lower dental pain and swelling. Patient states that the pain started several days ago and the swelling started this morning. Pain is worse with palpation and opening of her mouth. She is not having any difficulty swallowing or breathing. No fever. Patient knows that she has teeth that need to be pulled in that area. She has taken Aleve with some relief. No history of diabetes or other immune compromising states. Onset of symptoms acute. Course is worsening.  The history is provided by the patient.    History reviewed. No pertinent past medical history. Past Surgical History  Procedure Laterality Date  . Labial cyst     History reviewed. No pertinent family history. Social History  Substance Use Topics  . Smoking status: Never Smoker   . Smokeless tobacco: None  . Alcohol Use: No   OB History    No data available     Review of Systems  Constitutional: Negative for fever.  HENT: Positive for dental problem and facial swelling. Negative for ear pain, sore throat and trouble swallowing.   Respiratory: Negative for shortness of breath and stridor.   Musculoskeletal: Negative for neck pain.  Skin: Negative for color change.  Neurological: Negative for headaches.      Allergies  Review of patient's allergies indicates no known allergies.  Home Medications   Prior to Admission medications   Medication Sig Start Date End Date Taking? Authorizing Provider  HYDROcodone-acetaminophen (NORCO/VICODIN) 5-325 MG tablet Take 1-2 tablets every 6 hours as needed for severe pain 08/25/15   Renne CriglerJoshua Quanasia Defino, PA-C  ibuprofen (ADVIL,MOTRIN) 800 MG tablet Take 1 tablet  (800 mg total) by mouth 3 (three) times daily. 01/24/15   Fayrene HelperBowie Tran, PA-C  penicillin v potassium (VEETID) 500 MG tablet Take 1 tablet (500 mg total) by mouth 3 (three) times daily. 08/25/15   Renne CriglerJoshua Gabino Hagin, PA-C   BP 104/81 mmHg  Pulse 73  Temp(Src) 98.3 F (36.8 C) (Oral)  Resp 18  SpO2 100%   Physical Exam  Constitutional: She appears well-developed and well-nourished.  HENT:  Head: Normocephalic and atraumatic.  Right Ear: Tympanic membrane, external ear and ear canal normal.  Left Ear: Tympanic membrane, external ear and ear canal normal.  Nose: Nose normal.  Mouth/Throat: Uvula is midline, oropharynx is clear and moist and mucous membranes are normal. No trismus in the jaw. Abnormal dentition. Dental caries present. No dental abscesses or uvula swelling. No tonsillar abscesses.  Patient with L mandibular tooth pain and tenderness to palpation in area of tooth #18. Patient does not have a definitive palpable abscess but likely has a forming abscess. Mild trismus.  Eyes: Conjunctivae are normal.  Neck: Normal range of motion. Neck supple.  No neck swelling or Ludwig's angina. Patient with full range of motion of neck without any difficulty in all 6 directions.  Lymphadenopathy:    She has no cervical adenopathy.  Neurological: She is alert.  Skin: Skin is warm and dry.  Psychiatric: She has a normal mood and affect.  Nursing note and vitals reviewed.   ED Course  Procedures (including critical care time)  6:22 PM Patient seen and examined. Medications ordered.   Vital signs reviewed and are  as follows: BP 104/81 mmHg  Pulse 73  Temp(Src) 98.3 F (36.8 C) (Oral)  Resp 18  SpO2 100%  Patient counseled on use of narcotic pain medications. Counseled not to combine these medications with others containing tylenol. Urged not to drink alcohol, drive, or perform any other activities that requires focus while taking these medications. The patient verbalizes understanding and  agrees with the plan.  Patient counseled to take prescribed medications as directed, return with worsening facial or neck swelling, and to follow-up with their dentist as soon as possible.    MDM   Final diagnoses:  Dental abscess   Patient with toothache with associated dental abscess, likely forming periapical abscess. No fever. Exam unconcerning for Ludwig's angina or other deep tissue infection in neck. I do not feel that imaging is indicated at this time. Patient counseled on return instructions for Ludwig's angina.  As there is gum swelling, erythema, and facial swelling, will treat with antibiotic and pain medicine. Urged patient to follow-up with dentist.        Renne Crigler, PA-C 08/25/15 1842  Benjiman Core, MD 08/26/15 [phone]

## 2015-08-25 NOTE — ED Notes (Signed)
Pt sts left sided dental pain and swelling starting this am

## 2015-08-25 NOTE — Discharge Instructions (Signed)
Please read and follow all provided instructions.  Your diagnoses today include:  1. Dental abscess    The exam and treatment you received today has been provided on an emergency basis only. This is not a substitute for complete medical or dental care.  Tests performed today include:  Vital signs. See below for your results today.   Medications prescribed:   Penicillin - antibiotic  You have been prescribed an antibiotic medicine: take the entire course of medicine even if you are feeling better. Stopping early can cause the antibiotic not to work.   Vicodin (hydrocodone/acetaminophen) - narcotic pain medication  DO NOT drive or perform any activities that require you to be awake and alert because this medicine can make you drowsy. BE VERY CAREFUL not to take multiple medicines containing Tylenol (also called acetaminophen). Doing so can lead to an overdose which can damage your liver and cause liver failure and possibly death.  Take any prescribed medications only as directed.  Home care instructions:  Follow any educational materials contained in this packet.  Follow-up instructions: Please follow-up with your dentist for further evaluation of your symptoms.   Dental Assistance: See below for dental referrals  Return instructions:   Please return to the Emergency Department if you experience worsening symptoms.  Please return if you develop a fever, you develop more swelling in your face or neck, you have trouble breathing or swallowing food.  Please return if you have any other emergent concerns.  Additional Information:  Your vital signs today were: BP 104/81 mmHg   Pulse 73   Temp(Src) 98.3 F (36.8 C) (Oral)   Resp 18   SpO2 100% If your blood pressure (BP) was elevated above 135/85 this visit, please have this repeated by your doctor within one month. --------------

## 2016-10-14 ENCOUNTER — Emergency Department (HOSPITAL_COMMUNITY): Payer: BLUE CROSS/BLUE SHIELD

## 2016-10-14 ENCOUNTER — Emergency Department (HOSPITAL_COMMUNITY)
Admission: EM | Admit: 2016-10-14 | Discharge: 2016-10-14 | Disposition: A | Discharge status: Home / Self Care | Payer: BLUE CROSS/BLUE SHIELD | Attending: Emergency Medicine | Admitting: Emergency Medicine

## 2016-10-14 ENCOUNTER — Encounter (HOSPITAL_COMMUNITY): Payer: Self-pay

## 2016-10-14 DIAGNOSIS — R109 Unspecified abdominal pain: Secondary | ICD-10-CM | POA: Insufficient documentation

## 2016-10-14 LAB — COMPREHENSIVE METABOLIC PANEL
ALBUMIN: 3.6 g/dL (ref 3.5–5.0)
ALK PHOS: 60 U/L (ref 38–126)
ALT: 15 U/L (ref 14–54)
AST: 19 U/L (ref 15–41)
Anion gap: 7 (ref 5–15)
BILIRUBIN TOTAL: 0.6 mg/dL (ref 0.3–1.2)
BUN: 7 mg/dL (ref 6–20)
CALCIUM: 9.2 mg/dL (ref 8.9–10.3)
CO2: 26 mmol/L (ref 22–32)
Chloride: 103 mmol/L (ref 101–111)
Creatinine, Ser: 0.93 mg/dL (ref 0.44–1.00)
GFR calc Af Amer: >60 mL/min (ref >60)
GLUCOSE: 120 mg/dL — AB (ref 65–99)
POTASSIUM: 3.9 mmol/L (ref 3.5–5.1)
Sodium: 136 mmol/L (ref 135–145)
TOTAL PROTEIN: 7 g/dL (ref 6.5–8.1)

## 2016-10-14 LAB — CBC
HEMATOCRIT: 36.1 % (ref 36.0–46.0)
Hemoglobin: 11.8 g/dL — ABNORMAL LOW (ref 12.0–15.0)
MCH: 29.4 pg (ref 26.0–34.0)
MCHC: 32.7 g/dL (ref 30.0–36.0)
MCV: 90 fL (ref 78.0–100.0)
Platelets: 307 10*3/uL (ref 150–400)
RBC: 4.01 MIL/uL (ref 3.87–5.11)
RDW: 13.1 % (ref 11.5–15.5)
WBC: 5.6 10*3/uL (ref 4.0–10.5)

## 2016-10-14 LAB — PREGNANCY, URINE: PREG TEST UR: NEGATIVE

## 2016-10-14 LAB — URINALYSIS, ROUTINE W REFLEX MICROSCOPIC
BILIRUBIN URINE: NEGATIVE
GLUCOSE, UA: NEGATIVE mg/dL
HGB URINE DIPSTICK: NEGATIVE
KETONES UR: NEGATIVE mg/dL
Leukocytes, UA: NEGATIVE
Nitrite: NEGATIVE
PROTEIN: NEGATIVE mg/dL
Specific Gravity, Urine: 1.02 (ref 1.005–1.030)
pH: 5 (ref 5.0–8.0)

## 2016-10-14 LAB — LIPASE, BLOOD: Lipase: 31 U/L (ref 11–51)

## 2016-10-14 MED ORDER — MELOXICAM 7.5 MG PO TABS: 7.5000 mg | ORAL_TABLET | Freq: Every day | ORAL | 0 refills | Status: DC

## 2016-10-14 MED ORDER — IOPAMIDOL (ISOVUE-300) INJECTION 61%
INTRAVENOUS | Status: AC
  Administered 2016-10-14: 100 mL
  Filled 2016-10-14: qty 100

## 2016-10-14 MED ORDER — CYCLOBENZAPRINE HCL 10 MG PO TABS: 10.0000 mg | ORAL_TABLET | Freq: Two times a day (BID) | ORAL | 0 refills | Status: DC | PRN

## 2016-10-14 NOTE — ED Notes (Signed)
Patient transported to CT 

## 2016-10-14 NOTE — ED Notes (Signed)
Pt aware of need for urine  

## 2016-10-14 NOTE — ED Provider Notes (Signed)
[facility]-EMERGENCY DEPT Provider Note   CSN: [id number] Arrival date & time: 10/14/16  0758     History   Chief Complaint Chief Complaint  Patient presents with  . Flank Pain    HPI April Roberts is a 36 y.o. female who presents to the emergency department with a chief complaint of left flank pain that began yesterday. She reports that the pain initially began as a constant sharp pain, but has not changed to a constant dull ache with intermittent sharpness. She reports the pain is aggravated with direct pressure and alleviated by nothing. No treatment PTA.  She denies fever, chills, nausea, vomiting, diarrhea, constipation, dysuria, urinary frequency or hesitancy, vaginal discharge or odor, hematuria. LMP was 09/30/16. She does not currently use birth control; she reports her husband underwent a vasectomy. No history of abdominal surgery.  The history is provided by the patient. No language interpreter was used.    History reviewed. No pertinent past medical history.  There are no active problems to display for this patient.   Past Surgical History:  Procedure Laterality Date  . labial cyst      OB History    No data available       Home Medications    Prior to Admission medications   Medication Sig Start Date End Date Taking? Authorizing Provider  cyclobenzaprine (FLEXERIL) 10 MG tablet Take 1 tablet (10 mg total) by mouth 2 (two) times daily as needed for muscle spasms. 10/14/16   Sueko Dimichele A, PA-C  HYDROcodone-acetaminophen (NORCO/VICODIN) 5-325 MG tablet Take 1-2 tablets every 6 hours as needed for severe pain Patient not taking: Reported on 10/14/2016 08/25/15   Renne Crigler, PA-C  ibuprofen (ADVIL,MOTRIN) 800 MG tablet Take 1 tablet (800 mg total) by mouth 3 (three) times daily. Patient not taking: Reported on 10/14/2016 01/24/15   Fayrene Helper, PA-C  meloxicam (MOBIC) 7.5 MG tablet Take 1 tablet (7.5 mg total) by mouth daily. 10/14/16   Mili Piltz A, PA-C  penicillin v  potassium (VEETID) 500 MG tablet Take 1 tablet (500 mg total) by mouth 3 (three) times daily. Patient not taking: Reported on 10/14/2016 08/25/15   Renne Crigler, PA-C    Family History No family history on file.  Social History Social History  Substance Use Topics  . Smoking status: Never Smoker  . Smokeless tobacco: Not on file  . Alcohol use No     Allergies   Patient has no known allergies.   Review of Systems Review of Systems  Constitutional: Negative for activity change, chills and fever.  Respiratory: Negative for shortness of breath.   Cardiovascular: Negative for chest pain.  Gastrointestinal: Negative for abdominal pain, diarrhea, nausea and vomiting.  Genitourinary: Positive for flank pain (left). Negative for dysuria and frequency.  Musculoskeletal: Negative for back pain.  Skin: Negative for rash.    Physical Exam Updated Vital Signs BP 123/78   Pulse 67   Temp 98.5 F (36.9 C) (Oral)   Resp 20   Ht 5\' 2"  (1.575 m)   Wt 104.3 kg (230 lb)   LMP 09/30/2016   SpO2 100%   BMI 42.07 kg/m   Physical Exam  Constitutional: No distress.  HENT:  Head: Normocephalic.  Eyes: Conjunctivae are normal.  Neck: Neck supple.  Cardiovascular: Normal rate, regular rhythm and normal heart sounds.  Exam reveals no gallop and no friction rub.   No murmur heard. Pulmonary/Chest: Effort normal. No respiratory distress.  Abdominal: Soft. She exhibits no distension. There is  tenderness.  Tenderness to palpation over the left upper and lower quadrants with suprapubic and epigastric tenderness. Slight guarding. No rebound tenderness noted. Normoactive bowel sounds auscultated in all 4 quadrants.  Neurological: She is alert.  Skin: Skin is warm. No rash noted.  Psychiatric: Her behavior is normal.  Nursing note and vitals reviewed.  ED Treatments / Results  Labs (all labs ordered are listed, but only abnormal results are displayed) Labs Reviewed  COMPREHENSIVE METABOLIC  PANEL - Abnormal; Notable for the following:       Result Value   Glucose, Bld 120 (*)    All other components within normal limits  CBC - Abnormal; Notable for the following:    Hemoglobin 11.8 (*)    All other components within normal limits  URINALYSIS, ROUTINE W REFLEX MICROSCOPIC  PREGNANCY, URINE  LIPASE, BLOOD    EKG  EKG Interpretation None       Radiology Ct Abdomen Pelvis W Contrast  Result Date: 10/14/2016 CLINICAL DATA:  36 year old female with left flank and abdominal pain for 1 day. EXAM: CT ABDOMEN AND PELVIS WITH CONTRAST TECHNIQUE: Multidetector CT imaging of the abdomen and pelvis was performed using the standard protocol following bolus administration of intravenous contrast. CONTRAST:  100mL ISOVUE-300 IOPAMIDOL (ISOVUE-300) INJECTION 61% COMPARISON:  12/20/2006 and prior CTs FINDINGS: Lower chest: No acute abnormality Hepatobiliary: The liver and gallbladder are unremarkable. There is no evidence of biliary dilatation. Pancreas: Unremarkable Spleen: Unremarkable Adrenals/Urinary Tract: The kidneys, adrenal glands and bladder are unremarkable. Stomach/Bowel: Stomach is within normal limits. Appendix appears normal. No evidence of bowel wall thickening, distention, or inflammatory changes. Vascular/Lymphatic: No significant vascular findings are present. No enlarged abdominal or pelvic lymph nodes. Reproductive: Uterus and bilateral adnexa are unremarkable. Other: No abdominal wall hernia or abnormality. No abdominopelvic ascites. Musculoskeletal: No acute or significant osseous findings. IMPRESSION: Unremarkable CT of the abdomen and pelvis with contrast. Electronically Signed   By: Harmon PierJeffrey  Hu M.D.   On: 10/14/2016 12:31    Procedures Procedures (including critical care time)  Medications Ordered in ED Medications  iopamidol (ISOVUE-300) 61 % injection (100 mLs  Contrast Given 10/14/16 1150)     Initial Impression / Assessment and Plan / ED Course  I have reviewed  the triage vital signs and the nursing notes.  Pertinent labs & imaging results that were available during my care of the patient were reviewed by me and considered in my medical decision making (see chart for details).     Patient is nontoxic, nonseptic appearing, in no apparent distress.  Patient's pain and other symptoms adequately managed in emergency department. Labs, imaging and vitals reviewed.  Patient does not meet the SIRS or Sepsis criteria.  On repeat exam patient does not have a surgical abdomen and there are no peritoneal signs. CT negative for acute pathology. No indication of appendicitis, bowel obstruction, bowel perforation, cholecystitis, diverticulitis, PID or ectopic pregnancy.  Patient discharged home with symptomatic treatment and given strict instructions for follow-up with their primary care physician.  I have also discussed reasons to return immediately to the ER.  Patient expresses understanding and agrees with plan.  Final Clinical Impressions(s) / ED Diagnoses   Final diagnoses:  Acute left flank pain    New Prescriptions Discharge Medication List as of 10/14/2016  1:06 PM    START taking these medications   Details  cyclobenzaprine (FLEXERIL) 10 MG tablet Take 1 tablet (10 mg total) by mouth 2 (two) times daily as needed for muscle spasms.,  Starting Sat 10/14/2016, Print    meloxicam (MOBIC) 7.5 MG tablet Take 1 tablet (7.5 mg total) by mouth daily., Starting Sat 10/14/2016, Print         Mahesh Sizemore A, PA-C 10/15/16 1653    Marily Memos, MD 10/16/16 [phone]

## 2016-10-14 NOTE — Discharge Instructions (Signed)
Please call and schedule follow up appointment with your primary care provider if your symptoms are not improving in 5-7 days. You can take meloxicam once daily with food to help with muscle pain and inflammation. Please do not use ibuprofen if you are taking this medication. You can also apply ice or heat as needed. You can also take Flexeril for muscle cramping and pain. Please use caution with this medication if you are going to work or driving because it can make you sleepy.

## 2016-10-14 NOTE — ED Triage Notes (Signed)
Pt c/o left flank pain onset yesterday. Pt denies any other symptoms.

## 2017-01-25 ENCOUNTER — Emergency Department (HOSPITAL_COMMUNITY)
Admission: EM | Admit: 2017-01-25 | Discharge: 2017-01-25 | Disposition: A | Discharge status: Home / Self Care | Payer: BLUE CROSS/BLUE SHIELD | Attending: Emergency Medicine | Admitting: Emergency Medicine

## 2017-01-25 ENCOUNTER — Encounter (HOSPITAL_COMMUNITY): Payer: Self-pay | Admitting: Emergency Medicine

## 2017-01-25 ENCOUNTER — Emergency Department (HOSPITAL_COMMUNITY): Payer: BLUE CROSS/BLUE SHIELD

## 2017-01-25 DIAGNOSIS — M1712 Unilateral primary osteoarthritis, left knee: Secondary | ICD-10-CM

## 2017-01-25 DIAGNOSIS — Z79899 Other long term (current) drug therapy: Secondary | ICD-10-CM | POA: Insufficient documentation

## 2017-01-25 DIAGNOSIS — M25562 Pain in left knee: Secondary | ICD-10-CM | POA: Diagnosis present

## 2017-01-25 MED ORDER — KETOROLAC TROMETHAMINE 30 MG/ML IJ SOLN
30.0000 mg | Freq: Once | INTRAMUSCULAR | Status: AC
  Administered 2017-01-25: 30 mg via INTRAMUSCULAR
  Filled 2017-01-25: qty 1

## 2017-01-25 MED ORDER — NAPROXEN 500 MG PO TABS: 500.0000 mg | ORAL_TABLET | Freq: Two times a day (BID) | ORAL | 0 refills | Status: DC

## 2017-01-25 NOTE — ED Triage Notes (Signed)
Pt presents to ED for assessment of left knee pain, with swelling and stiffness x 1 year.  Over the past 2 days she has had a clicking sensation and sharp pains when walking.  Pt denies injury.  Pt had home brace on knee upon arrival.

## 2017-01-25 NOTE — Discharge Instructions (Signed)
Please read and follow all provided instructions.  You have been seen today for left knee pain consistent with arthritis.  Tests performed today include: An x-ray of the affected area - does NOT show any broken bones or dislocations. There was noted arthritis.  Vital signs. See below for your results today.   Home care instructions: --  Follow attached information.  Use Naproxen as needed for pain. For additional pain, use Tylenol.   Follow-up instructions: Please follow-up with your primary care provider or the provided orthopedic physician (bone specialist) in 1 week. In this case you may have a more severe injury that requires further care.   Return instructions:  Please return if your toes or feet are numb or tingling, appear gray or blue, or you have severe pain (also elevate the leg and loosen splint or wrap if you were given one) Please return to the Emergency Department if you experience worsening symptoms.  Please return if you have any other emergent concerns. Additional Information:  Your vital signs today were: BP 135/90 (BP Location: Right Arm)    Pulse 69    Temp 99 F (37.2 C) (Oral)    Resp 18    Ht  (1.575 m)    Wt 104.3 kg (230 lb)    LMP 01/08/2017    SpO2 100%    BMI 42.07 kg/m  If your blood pressure (BP) was elevated above 135/85 this visit, please have this repeated by your doctor within one month. ---------------

## 2017-01-25 NOTE — ED Provider Notes (Signed)
[facility]-EMERGENCY DEPT Provider Note   CSN: [id number] Arrival date & time: 01/25/17  1753     History   Chief Complaint Chief Complaint  Patient presents with  . Knee Pain    HPI April Roberts is a 37 y.o. female who presents to the emergency department today for left knee pain 1 year. The patient states that over the last year she has had consistent left knee pain, that she describes as an aching pain stiffness that is worse in the morning and with weather changes. The patient notes that she has intermittent swelling after long periods of walking but this is usually relieved after periods of rest. Over the last 2 days she has had clicking, popping and sharp pains when walking on the lateral aspect of the knee. She denies trauma, or fall. The patient has been taking advil for this with mild-moderate relief. She has a knee brace she picked up from the drug store that she feels is helping. She denies fever, chills, erythema. No numbness.tigiling or weakness. No IVDU.   HPI  History reviewed. No pertinent past medical history.  There are no active problems to display for this patient.   Past Surgical History:  Procedure Laterality Date  . labial cyst      OB History    No data available       Home Medications    Prior to Admission medications   Medication Sig Start Date End Date Taking? Authorizing Provider  cyclobenzaprine (FLEXERIL) 10 MG tablet Take 1 tablet (10 mg total) by mouth 2 (two) times daily as needed for muscle spasms. 10/14/16   McDonald, Mia A, PA-C  HYDROcodone-acetaminophen (NORCO/VICODIN) 5-325 MG tablet Take 1-2 tablets every 6 hours as needed for severe pain Patient not taking: Reported on 10/14/2016 08/25/15   Renne Crigler, PA-C  ibuprofen (ADVIL,MOTRIN) 800 MG tablet Take 1 tablet (800 mg total) by mouth 3 (three) times daily. Patient not taking: Reported on 10/14/2016 01/24/15   Fayrene Helper, PA-C  meloxicam (MOBIC) 7.5 MG tablet Take 1 tablet (7.5 mg total)  by mouth daily. 10/14/16   McDonald, Mia A, PA-C  penicillin v potassium (VEETID) 500 MG tablet Take 1 tablet (500 mg total) by mouth 3 (three) times daily. Patient not taking: Reported on 10/14/2016 08/25/15   Renne Crigler, PA-C    Family History History reviewed. No pertinent family history.  Social History Social History  Substance Use Topics  . Smoking status: Never Smoker  . Smokeless tobacco: Never Used  . Alcohol use No     Allergies   Patient has no known allergies.   Review of Systems Review of Systems  Constitutional: Negative for chills and fever.  Musculoskeletal: Positive for arthralgias and joint swelling.  Skin: Negative for color change and wound.  Neurological: Negative for weakness and numbness.     Physical Exam Updated Vital Signs BP 135/90 (BP Location: Right Arm)   Pulse 69   Temp 99 F (37.2 C) (Oral)   Resp 18   Ht  (1.575 m)   Wt 104.3 kg (230 lb)   LMP 01/08/2017   SpO2 100%   BMI 42.07 kg/m   Physical Exam  Constitutional: She appears well-developed and well-nourished.  HENT:  Head: Normocephalic and atraumatic.  Right Ear: External ear normal.  Left Ear: External ear normal.  Eyes: Conjunctivae are normal. Right eye exhibits no discharge. Left eye exhibits no discharge. No scleral icterus.  Pulmonary/Chest: Effort normal. No respiratory distress.  Musculoskeletal:  Left knee: Appearance normal. No obvious deformity. No skin erythema, heat, fluctuance or break of the skin. Mild joint effusion on ballotment. TTP over anterior joint line. Active and passive flexion to 90 degree's and extension to 180 degree's intact. Noted crepitus.  Negative Lachman's test. Negative anterior/poster drawer bilaterally. No varus or valgus laxity or locking. Positive McMurray's test on the lateral aspect. No TTP of knees or ankles. Compartments soft. Neurovascularly intact distally to site of injury.  Neurological: She is alert. Gait normal.  Skin: No  pallor.  Psychiatric: She has a normal mood and affect.  Nursing note and vitals reviewed.    ED Treatments / Results  Labs (all labs ordered are listed, but only abnormal results are displayed) Labs Reviewed - No data to display  EKG  EKG Interpretation None       Radiology Dg Knee Complete 4 Views Left  Result Date: 01/25/2017 CLINICAL DATA:  Anterior knee pain and swelling. EXAM: LEFT KNEE - COMPLETE 4+ VIEW COMPARISON:  None. FINDINGS: Four views study shows no fracture. No subluxation or dislocation. Loss of joint space is noted in the medial compartment. Hypertrophic spurring is visible in all 3 compartments, most advanced medially. Joint effusion evident in the suprapatellar bursa. No worrisome lytic or sclerotic osseous abnormality. IMPRESSION: Tricompartmental degenerative changes with joint effusion. Electronically Signed   By: Kennith Center M.D.   On: 01/25/2017 19:18    Procedures Procedures (including critical care time)  Medications Ordered in ED Medications  ketorolac (TORADOL) 30 MG/ML injection 30 mg (not administered)     Initial Impression / Assessment and Plan / ED Course  I have reviewed the triage vital signs and the nursing notes.  Pertinent labs & imaging results that were available during my care of the patient were reviewed by me and considered in my medical decision making (see chart for details).     Arthritis  Pt present with aching pain & joint stiffness worsened in the morning. On exam joints were tender with limited ROM, but no current medical concern for septic arthritis or joint as pt is afebrile & without warmth of the affected area.  Patient X-Ray negative for obvious fracture or dislocation. Tricompartmental degenerative changes with joint effusion. Presentation c/w dx of OA. Pain managed in ED. Pt advised to follow up with orthopedics if symptoms persist for further evaluation if conservative therapies do not work. Recommended PT,  exercise, and tylenol. Shot of Toradol given. Return precaurtions discussed. Patient will be dc home & is agreeable with above plan.   Final Clinical Impressions(s) / ED Diagnoses   Final diagnoses:  Arthritis of left knee    New Prescriptions New Prescriptions   No medications on file     Princella Pellegrini 01/25/17 2054    Alvira Monday, MD 01/26/17 [phone]

## 2018-02-26 ENCOUNTER — Encounter (HOSPITAL_COMMUNITY): Payer: Self-pay

## 2018-02-26 ENCOUNTER — Emergency Department (HOSPITAL_COMMUNITY): Payer: 59

## 2018-02-26 ENCOUNTER — Emergency Department (HOSPITAL_COMMUNITY)
Admission: EM | Admit: 2018-02-26 | Discharge: 2018-02-26 | Disposition: A | Discharge status: Home / Self Care | Payer: 59 | Attending: Emergency Medicine | Admitting: Emergency Medicine

## 2018-02-26 ENCOUNTER — Other Ambulatory Visit: Payer: Self-pay

## 2018-02-26 DIAGNOSIS — R109 Unspecified abdominal pain: Secondary | ICD-10-CM | POA: Insufficient documentation

## 2018-02-26 LAB — BASIC METABOLIC PANEL
Anion gap: 10 (ref 5–15)
BUN: 6 mg/dL (ref 6–20)
CHLORIDE: 106 mmol/L (ref 98–111)
CO2: 20 mmol/L — ABNORMAL LOW (ref 22–32)
CREATININE: 0.99 mg/dL (ref 0.44–1.00)
Calcium: 9 mg/dL (ref 8.9–10.3)
Glucose, Bld: 90 mg/dL (ref 70–99)
Potassium: 3.8 mmol/L (ref 3.5–5.1)
SODIUM: 136 mmol/L (ref 135–145)

## 2018-02-26 LAB — URINALYSIS, ROUTINE W REFLEX MICROSCOPIC
Bacteria, UA: NONE SEEN
Bilirubin Urine: NEGATIVE
GLUCOSE, UA: NEGATIVE mg/dL
Hgb urine dipstick: NEGATIVE
Ketones, ur: NEGATIVE mg/dL
Nitrite: NEGATIVE
PH: 5 (ref 5.0–8.0)
PROTEIN: NEGATIVE mg/dL
Specific Gravity, Urine: 1.019 (ref 1.005–1.030)

## 2018-02-26 LAB — I-STAT BETA HCG BLOOD, ED (MC, WL, AP ONLY): I-stat hCG, quantitative: <5 m[IU]/mL (ref <5)

## 2018-02-26 MED ORDER — KETOROLAC TROMETHAMINE 15 MG/ML IJ SOLN
15.0000 mg | Freq: Once | INTRAMUSCULAR | Status: AC
  Administered 2018-02-26: 15 mg via INTRAMUSCULAR
  Filled 2018-02-26: qty 1

## 2018-02-26 MED ORDER — KETOROLAC TROMETHAMINE 15 MG/ML IJ SOLN
15.0000 mg | Freq: Once | INTRAMUSCULAR | Status: DC
Start: 2018-02-26 — End: 2018-02-26

## 2018-02-26 NOTE — ED Notes (Signed)
Patient transported to X-ray 

## 2018-02-26 NOTE — ED Provider Notes (Addendum)
I saw and evaluated the patient, reviewed the resident's note and I agree with the findings and plan.  37 year old female here with left lateral chest pain.  Patient states is been gone for approximately a month initially was only there at night but now seen in the point where it seems to get worse throughout the day.  She has no associated fever, congestion, cough, trauma, vomiting or diarrhea.  She has no urinary symptoms. Exam she has tenderness to one area seems like it is in the ninth or 10th intercostal space without significant bony pain.  She has a couple areas of skin discoloration near this do not appear to be ecchymosis and those are not tender.  Is not worse when she takes a deep breath. Suspect is likely muscular skeletal.  She has had it for quite a while making that little bit less likely so we will just do an EKG, BMP and a chest x-ray to evaluate for any bony pathology renal pathology or cardiac pathology she had had a urinalysis which was essentially negative he did have leukocytes so we will send a culture but I think this is unrelated.  If all this is negative she will be discharged on symptomatic care.  Please see my separate note for my focused history, physical, medical decision making and applicable procedures for this patient encounter.    EKG Interpretation  Date/Time:    Ventricular Rate:    PR Interval:    QRS Duration:   QT Interval:    QTC Calculation:   R Axis:     Text Interpretation:           Marily Memos, MD 02/26/18 2332    Marily Memos, MD 03/02/18 4401

## 2018-02-26 NOTE — ED Provider Notes (Signed)
[facility] EMERGENCY DEPARTMENT Provider Note   CSN: [id number] Arrival date & time: 02/26/18  1208     History   Chief Complaint Chief Complaint  Patient presents with  . Flank Pain    HPI Jenniferann Stuckert is a 37 y.o. female.  HPI Patient is a 37 year old female without significant past medical history who presents emergency department for evaluation of left lateral chest pain.  Patient reports that this is been ongoing for approximately 1 month.  States that her pain is worsened with any palpation of the area or any significant movement.  Patient reports that the pain gets worse throughout the day.  States that she is only tried nighttime ibuprofen and is unsure if this is helped that she is usually able to fall asleep immediately following taking the medication.  She denies any trauma to the area.  She has no other associated symptoms at this time.  She denies any fevers, chills, nausea, vomiting, abdominal pain, dysuria, hematuria, history of kidney stones, or significant skin changes.  Remaining review of systems is negative at this time. History reviewed. No pertinent past medical history.  There are no active problems to display for this patient.   Past Surgical History:  Procedure Laterality Date  . labial cyst       OB History   None      Home Medications    Prior to Admission medications   Medication Sig Start Date End Date Taking? Authorizing Provider  cyclobenzaprine (FLEXERIL) 10 MG tablet Take 1 tablet (10 mg total) by mouth 2 (two) times daily as needed for muscle spasms. Patient not taking: Reported on 02/26/2018 10/14/16   Frederik Pear A, PA-C  HYDROcodone-acetaminophen (NORCO/VICODIN) 5-325 MG tablet Take 1-2 tablets every 6 hours as needed for severe pain Patient not taking: Reported on 10/14/2016 08/25/15   Renne Crigler, PA-C  ibuprofen (ADVIL,MOTRIN) 800 MG tablet Take 1 tablet (800 mg total) by mouth 3 (three) times daily. Patient  not taking: Reported on 10/14/2016 01/24/15   Fayrene Helper, PA-C  meloxicam (MOBIC) 7.5 MG tablet Take 1 tablet (7.5 mg total) by mouth daily. Patient not taking: Reported on 02/26/2018 10/14/16   McDonald, Mia A, PA-C  naproxen (NAPROSYN) 500 MG tablet Take 1 tablet (500 mg total) by mouth 2 (two) times daily. Patient not taking: Reported on 02/26/2018 01/25/17   Jacinto Halim, PA-C  penicillin v potassium (VEETID) 500 MG tablet Take 1 tablet (500 mg total) by mouth 3 (three) times daily. Patient not taking: Reported on 10/14/2016 08/25/15   Renne Crigler, PA-C    Family History History reviewed. No pertinent family history.  Social History Social History   Tobacco Use  . Smoking status: Never Smoker  . Smokeless tobacco: Never Used  Substance Use Topics  . Alcohol use: No  . Drug use: No     Allergies   Patient has no known allergies.   Review of Systems Review of Systems  Constitutional: Negative for chills and fever.  HENT: Negative for ear pain and sore throat.   Eyes: Negative for pain and visual disturbance.  Respiratory: Negative for cough and shortness of breath.   Cardiovascular: Positive for chest pain (Lateral, left sided). Negative for palpitations.  Gastrointestinal: Negative for abdominal pain and vomiting.  Genitourinary: Positive for flank pain. Negative for dysuria and hematuria.  Musculoskeletal: Negative for arthralgias and back pain.  Skin: Negative for color change and rash.  Neurological: Negative for seizures and syncope.  All  other systems reviewed and are negative.    Physical Exam Updated Vital Signs BP 114/85   Pulse 71   Temp 98.4 F (36.9 C) (Oral)   Resp 12   LMP 02/08/2018 (Exact Date)   SpO2 100%   Physical Exam  Constitutional: She appears well-developed and well-nourished. No distress.  HENT:  Head: Normocephalic and atraumatic.  Eyes: Conjunctivae are normal.  Neck: Neck supple.  Cardiovascular: Normal rate and regular rhythm.   No murmur heard. Pulmonary/Chest: Effort normal and breath sounds normal. No respiratory distress.  Patient with TTP in the area of the 9/10th intercostal spaces. Some skin discoloration in this area that patient reports is chronic and is not consistent with ecchymosis.   Abdominal: Soft. There is no tenderness.  Musculoskeletal: She exhibits no edema.  Neurological: She is alert.  Skin: Skin is warm and dry.  Psychiatric: She has a normal mood and affect.  Nursing note and vitals reviewed.    ED Treatments / Results  Labs (all labs ordered are listed, but only abnormal results are displayed) Labs Reviewed  URINALYSIS, ROUTINE W REFLEX MICROSCOPIC - Abnormal; Notable for the following components:      Result Value   Leukocytes, UA SMALL (*)    All other components within normal limits  BASIC METABOLIC PANEL - Abnormal; Notable for the following components:   CO2 20 (*)    All other components within normal limits  URINE CULTURE  I-STAT BETA HCG BLOOD, ED ([facility], [facility], [facility] ONLY)  POC URINE PREG, ED  I-STAT BETA HCG BLOOD, ED ([facility], [facility], [facility] ONLY)    EKG EKG Interpretation  Date/Time:  Tuesday February 26 2018 18:25:55 EDT Ventricular Rate:  64 PR Interval:    QRS Duration: 73 QT Interval:  392 QTC Calculation: 405 R Axis:   11 Text Interpretation:  Sinus rhythm Low voltage, precordial leads Borderline T abnormalities, anterior leads No significant change since last tracing Confirmed by Marily Memos [phone]) on 02/26/2018 6:47:26 PM   Radiology Dg Chest 2 View  Result Date: 02/26/2018 CLINICAL DATA:  Chest pain. EXAM: CHEST - 2 VIEW COMPARISON:  Radiographs of December 07, 2011. FINDINGS: The heart size and mediastinal contours are within normal limits. Both lungs are clear. No pneumothorax or pleural effusion is noted. The visualized skeletal structures are unremarkable. IMPRESSION: No active cardiopulmonary disease. Electronically Signed   By: Lupita Raider, M.D.   On: 02/26/2018  16:33    Procedures Procedures (including critical care time)  Medications Ordered in ED Medications  ketorolac (TORADOL) 15 MG/ML injection 15 mg (15 mg Intramuscular Given 02/26/18 1859)     Initial Impression / Assessment and Plan / ED Course  I have reviewed the triage vital signs and the nursing notes.  Pertinent labs & imaging results that were available during my care of the patient were reviewed by me and considered in my medical decision making (see chart for details).     Patient is a 37 year old female with a past medical history as detailed above who presents the emergency department for evaluation of left lateral chest pain.  On exam patient does have significant tenderness to palpation in approximately the 9th-10th intercostal space without any significant bony pain.  There is mild skin discoloration, but this does not appear to be any bruising and patient denies any trauma to the area.  Secondary to patient's arrival complaint laboratory and imaging studies were obtained.  Patient's chest x-ray does not reveal any acute cardiac or pulmonary abnormality.  Her EKG is overall reassuring.  Laboratory studies are also unremarkable.  Given patient's presentation and concern for possible musculoskeletal pain she was given Toradol while in the emergency department.  This gave patient significant symptomatic improvement.  At this time, I do not believe patient's pain is caused by a cardiac or pulmonary process.  Her UA is not suggestive of acute renal pathology and her BMP does not show any significant renal dysfunction.  As a result, patient will be discharged from the emergency department at this time.  She will be instructed to use scheduled NSAIDs for the next few days and follow-up with her primary care physician for further evaluation.  Prior to discharge she was given strict return precautions to return to the emergency department should her symptoms acutely worsen or any new,  concerning symptoms arise.  Patient was in no acute distress at the time of discharge.  The care of this patient was discussed with my attending physician Dr. Clayborne Dana, who voices agreement with work-up and ED disposition.  Final Clinical Impressions(s) / ED Diagnoses   Final diagnoses:  Flank pain    ED Discharge Orders    None       Alita Waldren, Winfield Rast, MD 02/27/18 2243    Marily Memos, MD 03/02/18 Ardelle Anton, MD 03/02/18 7829

## 2018-02-26 NOTE — ED Triage Notes (Signed)
Pt states she has been having left side flank pain for a while, has gotten progressively worse. Denies hx of kidney stone or current dysuria. Pt ambulatory.

## 2018-02-28 LAB — URINE CULTURE: CULTURE: NO GROWTH

## 2018-12-09 ENCOUNTER — Other Ambulatory Visit: Payer: Self-pay | Admitting: Obstetrics and Gynecology

## 2018-12-09 ENCOUNTER — Other Ambulatory Visit (HOSPITAL_COMMUNITY)
Admission: RE | Admit: 2018-12-09 | Discharge: 2018-12-09 | Disposition: A | Discharge status: Home / Self Care | Payer: 59 | Source: Ambulatory Visit | Attending: Obstetrics and Gynecology | Admitting: Obstetrics and Gynecology

## 2018-12-09 DIAGNOSIS — Z01419 Encounter for gynecological examination (general) (routine) without abnormal findings: Secondary | ICD-10-CM | POA: Insufficient documentation

## 2018-12-13 LAB — CYTOLOGY - PAP
Chlamydia: NEGATIVE
Diagnosis: NEGATIVE
HPV 16/18/45 genotyping: NEGATIVE
HPV: DETECTED — AB
Neisseria Gonorrhea: NEGATIVE

## 2020-08-22 ENCOUNTER — Other Ambulatory Visit: Payer: Self-pay

## 2020-08-22 ENCOUNTER — Encounter (HOSPITAL_COMMUNITY): Payer: Self-pay | Admitting: Emergency Medicine

## 2020-08-22 ENCOUNTER — Emergency Department (HOSPITAL_COMMUNITY)
Admission: EM | Admit: 2020-08-22 | Discharge: 2020-08-22 | Disposition: A | Discharge status: Home / Self Care | Payer: 59 | Attending: Emergency Medicine | Admitting: Emergency Medicine

## 2020-08-22 DIAGNOSIS — N764 Abscess of vulva: Secondary | ICD-10-CM | POA: Insufficient documentation

## 2020-08-22 LAB — PREGNANCY, URINE: Preg Test, Ur: NEGATIVE

## 2020-08-22 MED ORDER — NAPROXEN 500 MG PO TABS: 500.0000 mg | ORAL_TABLET | Freq: Two times a day (BID) | ORAL | 0 refills | Status: DC

## 2020-08-22 MED ORDER — DOXYCYCLINE HYCLATE 100 MG PO CAPS: 100.0000 mg | ORAL_CAPSULE | Freq: Two times a day (BID) | ORAL | 0 refills | Status: AC

## 2020-08-22 MED ORDER — LIDOCAINE HCL (PF) 1 % IJ SOLN
30.0000 mL | Freq: Once | INTRAMUSCULAR | Status: AC
Start: 2020-08-22 — End: 2020-08-22
  Administered 2020-08-22: 30 mL
  Filled 2020-08-22: qty 30

## 2020-08-22 MED ORDER — IBUPROFEN 400 MG PO TABS
600.0000 mg | ORAL_TABLET | Freq: Once | ORAL | Status: AC
  Administered 2020-08-22: 600 mg via ORAL
  Filled 2020-08-22: qty 1

## 2020-08-22 MED ORDER — OXYCODONE-ACETAMINOPHEN 5-325 MG PO TABS
1.0000 | ORAL_TABLET | Freq: Once | ORAL | Status: AC
  Administered 2020-08-22: 1 via ORAL
  Filled 2020-08-22: qty 1

## 2020-08-22 NOTE — ED Provider Notes (Signed)
[facility] EMERGENCY DEPARTMENT Provider Note   CSN: [id number] Arrival date & time: 08/22/20  1644     History Chief Complaint  Patient presents with  . Abscess    April Roberts is a 40 y.o. female presenting to the ED with a chief complaint of abscess.  Noticed some swelling and pain in her vaginal area for the past 2 days.  She is concerned that it may be spontaneously draining.  Denies any fevers or chills, abnormal bleeding or discharge.  States that she had similar symptoms about 15 years ago that improved with incision and drainage.  HPI     History reviewed. No pertinent past medical history.  There are no problems to display for this patient.   Past Surgical History:  Procedure Laterality Date  . labial cyst       OB History   No obstetric history on file.     No family history on file.  Social History   Tobacco Use  . Smoking status: Never Smoker  . Smokeless tobacco: Never Used  Substance Use Topics  . Alcohol use: No  . Drug use: No    Home Medications Prior to Admission medications   Medication Sig Start Date End Date Taking? Authorizing Provider  doxycycline (VIBRAMYCIN) 100 MG capsule Take 1 capsule (100 mg total) by mouth 2 (two) times daily for 7 days. 08/22/20 08/29/20 Yes Joslynne Klatt, PA-C  naproxen (NAPROSYN) 500 MG tablet Take 1 tablet (500 mg total) by mouth 2 (two) times daily. 08/22/20  Yes Aliya Sol, PA-C  cyclobenzaprine (FLEXERIL) 10 MG tablet Take 1 tablet (10 mg total) by mouth 2 (two) times daily as needed for muscle spasms. Patient not taking: Reported on 02/26/2018 10/14/16   Frederik Pear A, PA-C  HYDROcodone-acetaminophen (NORCO/VICODIN) 5-325 MG tablet Take 1-2 tablets every 6 hours as needed for severe pain Patient not taking: Reported on 10/14/2016 08/25/15   Renne Crigler, PA-C  ibuprofen (ADVIL,MOTRIN) 800 MG tablet Take 1 tablet (800 mg total) by mouth 3 (three) times daily. Patient not taking: Reported  on 10/14/2016 01/24/15   Fayrene Helper, PA-C  meloxicam (MOBIC) 7.5 MG tablet Take 1 tablet (7.5 mg total) by mouth daily. Patient not taking: Reported on 02/26/2018 10/14/16   McDonald, Mia A, PA-C  penicillin v potassium (VEETID) 500 MG tablet Take 1 tablet (500 mg total) by mouth 3 (three) times daily. Patient not taking: Reported on 10/14/2016 08/25/15   Renne Crigler, PA-C    Allergies    Patient has no known allergies.  Review of Systems   Review of Systems  Constitutional: Negative for appetite change, chills and fever.  HENT: Negative for ear pain, rhinorrhea, sneezing and sore throat.   Eyes: Negative for photophobia and visual disturbance.  Respiratory: Negative for cough, chest tightness, shortness of breath and wheezing.   Cardiovascular: Negative for chest pain and palpitations.  Gastrointestinal: Negative for abdominal pain, blood in stool, constipation, diarrhea, nausea and vomiting.  Genitourinary: Negative for dysuria, hematuria and urgency.  Musculoskeletal: Negative for myalgias.  Skin: Positive for wound. Negative for rash.  Neurological: Negative for dizziness, weakness and light-headedness.    Physical Exam Updated Vital Signs BP (!) 150/94 (BP Location: Left Arm)   Pulse 91   Temp 98.1 F (36.7 C) (Oral)   Resp 16   LMP 08/05/2020   SpO2 99%   Physical Exam Vitals and nursing note reviewed. Exam conducted with a chaperone present.  Constitutional:  General: She is not in acute distress.    Appearance: She is well-developed. She is not diaphoretic.  HENT:     Head: Normocephalic and atraumatic.  Eyes:     General: No scleral icterus.    Conjunctiva/sclera: Conjunctivae normal.  Pulmonary:     Effort: Pulmonary effort is normal. No respiratory distress.  Genitourinary:    Comments: Chaperone present.  GU exam: I was unable to palpate any area of fluctuance or induration where the patient states that she felt discomfort on the right labial area.  She was  tender on my exam but there was no evidence of a fluid collection.  I did see a large amount of what appeared to be purulent drainage in the area.  There is slight palpable induration compared to palpation of the left labial area.  No active bleeding. Musculoskeletal:     Cervical back: Normal range of motion.  Skin:    Findings: No rash.  Neurological:     Mental Status: She is alert.     ED Results / Procedures / Treatments   Labs (all labs ordered are listed, but only abnormal results are displayed) Labs Reviewed  PREGNANCY, URINE    EKG None  Radiology No results found.  Procedures Procedures   Medications Ordered in ED Medications  lidocaine (PF) (XYLOCAINE) 1 % injection 30 mL (30 mLs Infiltration Given by Other 08/22/20 1902)  oxyCODONE-acetaminophen (PERCOCET/ROXICET) 5-325 MG per tablet 1 tablet (1 tablet Oral Given 08/22/20 2047)  ibuprofen (ADVIL) tablet 600 mg (600 mg Oral Given 08/22/20 2047)    ED Course  I have reviewed the triage vital signs and the nursing notes.  Pertinent labs & imaging results that were available during my care of the patient were reviewed by me and considered in my medical decision making (see chart for details).    MDM Rules/Calculators/A&P                          40 year old female presenting to the ED with chief complaint of abscess.  Noticed some swelling and pain in vaginal area for the past 2 days.  Reports similar symptoms in the past.  She is concerned that since using the bathroom today and may be spontaneously draining.  Denies any fevers.  Physical exam findings I was unable to palpate any area of fluctuance or induration where patient states that she feels discomfort.  I did see a large amount of purulent drainage in the area which is suspicious for a spontaneously draining abscess.  I do not see an area that would be amenable to incision or drainage at this time.  We will place her on antibiotics and have her follow-up either  with her primary care provider or here in 2 days for a recheck if her symptoms worsen.  Patient agrees to this plan.  All imaging, if done today, including plain films, CT scans, and ultrasounds, independently reviewed by me, and interpretations confirmed via formal radiology reads.  Patient is hemodynamically stable, in NAD, and able to ambulate in the ED. Evaluation does not show pathology that would require ongoing emergent intervention or inpatient treatment. I explained the diagnosis to the patient. Pain has been managed and has no complaints prior to discharge. Patient is comfortable with above plan and is stable for discharge at this time. All questions were answered prior to disposition. Strict return precautions for returning to the ED were discussed. Encouraged follow up with PCP.  An After Visit Summary was printed and given to the patient.   Portions of this note were generated with Scientist, clinical (histocompatibility and immunogenetics). Dictation errors may occur despite best attempts at proofreading.  Final Clinical Impression(s) / ED Diagnoses Final diagnoses:  Labial abscess    Rx / DC Orders ED Discharge Orders         Ordered    doxycycline (VIBRAMYCIN) 100 MG capsule  2 times daily        08/22/20 1954    naproxen (NAPROSYN) 500 MG tablet  2 times daily        08/22/20 1954           Dietrich Pates, Cordelia Poche 08/22/20 2113    Margarita Grizzle, MD 08/22/20 2317

## 2020-08-22 NOTE — Discharge Instructions (Signed)
Take the antibiotics as prescribed. Take the pain medicine as well. You will need to follow-up in 2 days if your symptoms worsen, you develop more pain or swelling, fever despite taking the antibiotics

## 2020-08-22 NOTE — ED Triage Notes (Signed)
C/o labial cyst that has gotten worse in the past 2 days.  Reports history of same approx 15 years ago.

## 2021-01-26 ENCOUNTER — Other Ambulatory Visit: Payer: Self-pay | Admitting: Internal Medicine

## 2021-01-26 ENCOUNTER — Ambulatory Visit
Admission: RE | Admit: 2021-01-26 | Discharge: 2021-01-26 | Disposition: A | Discharge status: Home / Self Care | Payer: 59 | Source: Ambulatory Visit | Attending: Internal Medicine | Admitting: Internal Medicine

## 2021-01-26 DIAGNOSIS — M25561 Pain in right knee: Secondary | ICD-10-CM

## 2021-06-29 DIAGNOSIS — M5432 Sciatica, left side: Secondary | ICD-10-CM | POA: Diagnosis not present

## 2021-07-07 DIAGNOSIS — M5432 Sciatica, left side: Secondary | ICD-10-CM | POA: Diagnosis not present

## 2021-07-25 DIAGNOSIS — D649 Anemia, unspecified: Secondary | ICD-10-CM | POA: Diagnosis not present

## 2021-07-25 DIAGNOSIS — Z6841 Body Mass Index (BMI) 40.0 and over, adult: Secondary | ICD-10-CM | POA: Diagnosis not present

## 2021-07-25 DIAGNOSIS — Z13220 Encounter for screening for lipoid disorders: Secondary | ICD-10-CM | POA: Diagnosis not present

## 2021-07-25 DIAGNOSIS — R5382 Chronic fatigue, unspecified: Secondary | ICD-10-CM | POA: Diagnosis not present

## 2021-07-25 DIAGNOSIS — Z0000 Encounter for general adult medical examination without abnormal findings: Secondary | ICD-10-CM | POA: Diagnosis not present

## 2021-07-28 ENCOUNTER — Other Ambulatory Visit: Payer: Self-pay | Admitting: Internal Medicine

## 2021-07-28 ENCOUNTER — Other Ambulatory Visit: Payer: Self-pay | Admitting: Hematology and Oncology

## 2021-07-28 DIAGNOSIS — Z1231 Encounter for screening mammogram for malignant neoplasm of breast: Secondary | ICD-10-CM

## 2021-07-29 ENCOUNTER — Other Ambulatory Visit: Payer: Self-pay

## 2021-07-29 ENCOUNTER — Ambulatory Visit
Admission: RE | Admit: 2021-07-29 | Discharge: 2021-07-29 | Disposition: A | Discharge status: Home / Self Care | Payer: BC Managed Care – PPO | Source: Ambulatory Visit | Attending: Internal Medicine | Admitting: Internal Medicine

## 2021-07-29 DIAGNOSIS — Z1231 Encounter for screening mammogram for malignant neoplasm of breast: Secondary | ICD-10-CM | POA: Diagnosis not present

## 2021-08-01 ENCOUNTER — Other Ambulatory Visit: Payer: Self-pay | Admitting: Internal Medicine

## 2021-08-01 ENCOUNTER — Other Ambulatory Visit (HOSPITAL_COMMUNITY)
Admission: RE | Admit: 2021-08-01 | Discharge: 2021-08-01 | Disposition: A | Discharge status: Home / Self Care | Payer: BC Managed Care – PPO | Source: Ambulatory Visit | Attending: Nurse Practitioner | Admitting: Nurse Practitioner

## 2021-08-01 DIAGNOSIS — Z01419 Encounter for gynecological examination (general) (routine) without abnormal findings: Secondary | ICD-10-CM | POA: Insufficient documentation

## 2021-08-01 DIAGNOSIS — Z86018 Personal history of other benign neoplasm: Secondary | ICD-10-CM | POA: Diagnosis not present

## 2021-08-01 DIAGNOSIS — R928 Other abnormal and inconclusive findings on diagnostic imaging of breast: Secondary | ICD-10-CM

## 2021-08-01 DIAGNOSIS — Z113 Encounter for screening for infections with a predominantly sexual mode of transmission: Secondary | ICD-10-CM | POA: Diagnosis not present

## 2021-08-01 DIAGNOSIS — N920 Excessive and frequent menstruation with regular cycle: Secondary | ICD-10-CM | POA: Diagnosis not present

## 2021-08-03 LAB — CYTOLOGY - PAP
Comment: NEGATIVE
Diagnosis: NEGATIVE
High risk HPV: NEGATIVE

## 2021-08-15 DIAGNOSIS — N920 Excessive and frequent menstruation with regular cycle: Secondary | ICD-10-CM | POA: Diagnosis not present

## 2021-08-15 DIAGNOSIS — D259 Leiomyoma of uterus, unspecified: Secondary | ICD-10-CM | POA: Diagnosis not present

## 2021-08-16 ENCOUNTER — Ambulatory Visit
Admission: RE | Admit: 2021-08-16 | Discharge: 2021-08-16 | Disposition: A | Discharge status: Home / Self Care | Payer: BC Managed Care – PPO | Source: Ambulatory Visit | Attending: Internal Medicine | Admitting: Internal Medicine

## 2021-08-16 ENCOUNTER — Other Ambulatory Visit: Payer: Self-pay | Admitting: Internal Medicine

## 2021-08-16 DIAGNOSIS — R928 Other abnormal and inconclusive findings on diagnostic imaging of breast: Secondary | ICD-10-CM

## 2021-08-16 DIAGNOSIS — N6489 Other specified disorders of breast: Secondary | ICD-10-CM

## 2021-08-16 DIAGNOSIS — R922 Inconclusive mammogram: Secondary | ICD-10-CM | POA: Diagnosis not present

## 2021-08-23 ENCOUNTER — Other Ambulatory Visit: Payer: Self-pay

## 2021-08-23 DIAGNOSIS — N920 Excessive and frequent menstruation with regular cycle: Secondary | ICD-10-CM | POA: Diagnosis not present

## 2021-08-23 DIAGNOSIS — Z3202 Encounter for pregnancy test, result negative: Secondary | ICD-10-CM | POA: Diagnosis not present

## 2021-08-24 DIAGNOSIS — G4719 Other hypersomnia: Secondary | ICD-10-CM | POA: Diagnosis not present

## 2021-11-22 ENCOUNTER — Other Ambulatory Visit: Payer: Self-pay | Admitting: Internal Medicine

## 2021-11-22 ENCOUNTER — Other Ambulatory Visit (HOSPITAL_COMMUNITY): Payer: Self-pay | Admitting: Internal Medicine

## 2021-11-22 ENCOUNTER — Ambulatory Visit (HOSPITAL_COMMUNITY)
Admission: RE | Admit: 2021-11-22 | Discharge: 2021-11-22 | Disposition: A | Discharge status: Home / Self Care | Payer: BC Managed Care – PPO | Source: Ambulatory Visit | Attending: Internal Medicine | Admitting: Internal Medicine

## 2021-11-22 DIAGNOSIS — M7989 Other specified soft tissue disorders: Secondary | ICD-10-CM

## 2021-11-22 DIAGNOSIS — M170 Bilateral primary osteoarthritis of knee: Secondary | ICD-10-CM | POA: Diagnosis not present

## 2021-11-22 NOTE — Progress Notes (Signed)
Left lower extremity venous duplex completed. Refer to "CV Proc" under chart review to view preliminary results.  11/22/2021 12:16 PM Eula Fried., MHA, RVT, RDCS, RDMS

## 2021-11-23 ENCOUNTER — Other Ambulatory Visit: Payer: BC Managed Care – PPO

## 2021-12-15 DIAGNOSIS — M1712 Unilateral primary osteoarthritis, left knee: Secondary | ICD-10-CM | POA: Diagnosis not present

## 2021-12-15 DIAGNOSIS — M170 Bilateral primary osteoarthritis of knee: Secondary | ICD-10-CM | POA: Diagnosis not present

## 2021-12-15 DIAGNOSIS — M1711 Unilateral primary osteoarthritis, right knee: Secondary | ICD-10-CM | POA: Diagnosis not present

## 2022-01-06 DIAGNOSIS — M170 Bilateral primary osteoarthritis of knee: Secondary | ICD-10-CM | POA: Diagnosis not present

## 2022-01-06 DIAGNOSIS — M25661 Stiffness of right knee, not elsewhere classified: Secondary | ICD-10-CM | POA: Diagnosis not present

## 2022-01-06 DIAGNOSIS — M6281 Muscle weakness (generalized): Secondary | ICD-10-CM | POA: Diagnosis not present

## 2022-01-06 DIAGNOSIS — M25662 Stiffness of left knee, not elsewhere classified: Secondary | ICD-10-CM | POA: Diagnosis not present

## 2022-01-13 DIAGNOSIS — M170 Bilateral primary osteoarthritis of knee: Secondary | ICD-10-CM | POA: Diagnosis not present

## 2022-01-13 DIAGNOSIS — M6281 Muscle weakness (generalized): Secondary | ICD-10-CM | POA: Diagnosis not present

## 2022-01-13 DIAGNOSIS — M25661 Stiffness of right knee, not elsewhere classified: Secondary | ICD-10-CM | POA: Diagnosis not present

## 2022-01-13 DIAGNOSIS — M25662 Stiffness of left knee, not elsewhere classified: Secondary | ICD-10-CM | POA: Diagnosis not present

## 2022-01-26 DIAGNOSIS — M25662 Stiffness of left knee, not elsewhere classified: Secondary | ICD-10-CM | POA: Diagnosis not present

## 2022-01-26 DIAGNOSIS — M25661 Stiffness of right knee, not elsewhere classified: Secondary | ICD-10-CM | POA: Diagnosis not present

## 2022-01-26 DIAGNOSIS — M6281 Muscle weakness (generalized): Secondary | ICD-10-CM | POA: Diagnosis not present

## 2022-01-26 DIAGNOSIS — M170 Bilateral primary osteoarthritis of knee: Secondary | ICD-10-CM | POA: Diagnosis not present

## 2022-02-03 DIAGNOSIS — M25661 Stiffness of right knee, not elsewhere classified: Secondary | ICD-10-CM | POA: Diagnosis not present

## 2022-02-03 DIAGNOSIS — M6281 Muscle weakness (generalized): Secondary | ICD-10-CM | POA: Diagnosis not present

## 2022-02-03 DIAGNOSIS — M25662 Stiffness of left knee, not elsewhere classified: Secondary | ICD-10-CM | POA: Diagnosis not present

## 2022-02-03 DIAGNOSIS — M170 Bilateral primary osteoarthritis of knee: Secondary | ICD-10-CM | POA: Diagnosis not present

## 2022-02-16 ENCOUNTER — Ambulatory Visit
Admission: RE | Admit: 2022-02-16 | Discharge: 2022-02-16 | Disposition: A | Discharge status: Home / Self Care | Payer: BC Managed Care – PPO | Source: Ambulatory Visit | Attending: Internal Medicine | Admitting: Internal Medicine

## 2022-02-16 DIAGNOSIS — N6489 Other specified disorders of breast: Secondary | ICD-10-CM

## 2022-02-16 DIAGNOSIS — R928 Other abnormal and inconclusive findings on diagnostic imaging of breast: Secondary | ICD-10-CM | POA: Diagnosis not present

## 2022-04-27 IMAGING — US US BREAST*L* LIMITED INC AXILLA
1 series · 12 of 12 positions shown · non-contrast
Comparison: Prior films

CLINICAL DATA: Callback from screening mammogram for possible
asymmetry left breast

EXAM:
DIGITAL DIAGNOSTIC UNILATERAL LEFT MAMMOGRAM WITH TOMOSYNTHESIS AND
CAD; ULTRASOUND LEFT BREAST LIMITED
TECHNIQUE: Left digital diagnostic mammography and breast tomosynthesis was
performed. The images were evaluated with computer-aided detection.;
Targeted ultrasound examination of the left breast was performed.

[Series 1: us breast*left* limited inc axilla · 0.07mm/px · 12 of 12 slices shown]
[im 1/12]
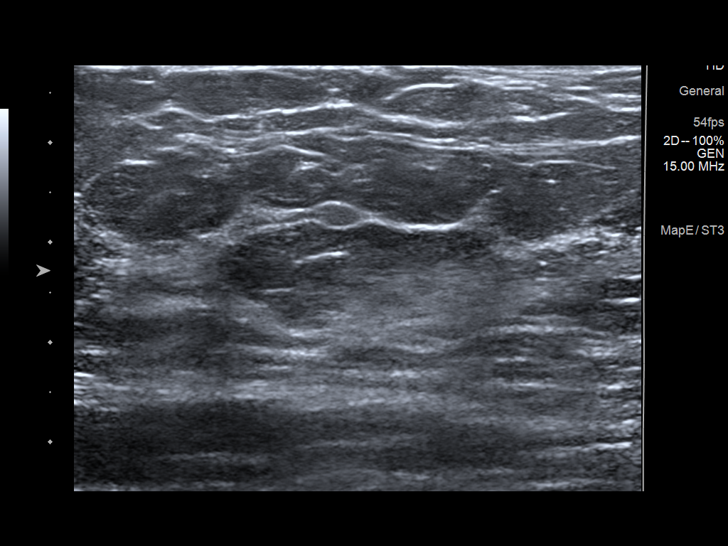
[im 2/12]
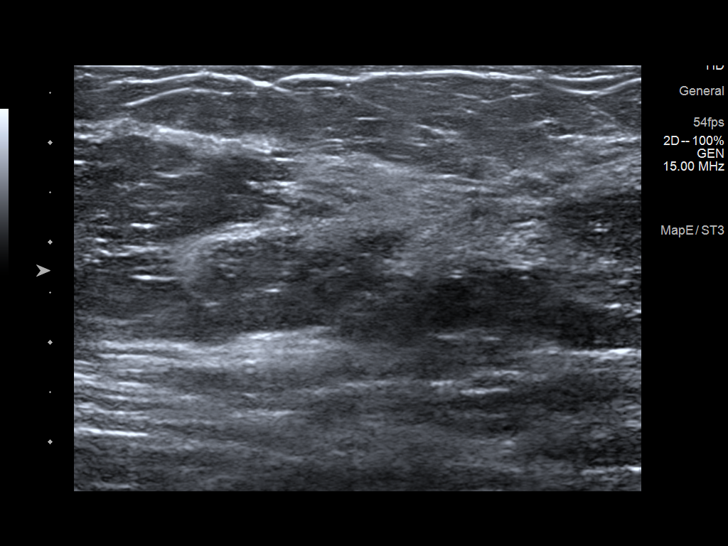
[im 3/12]
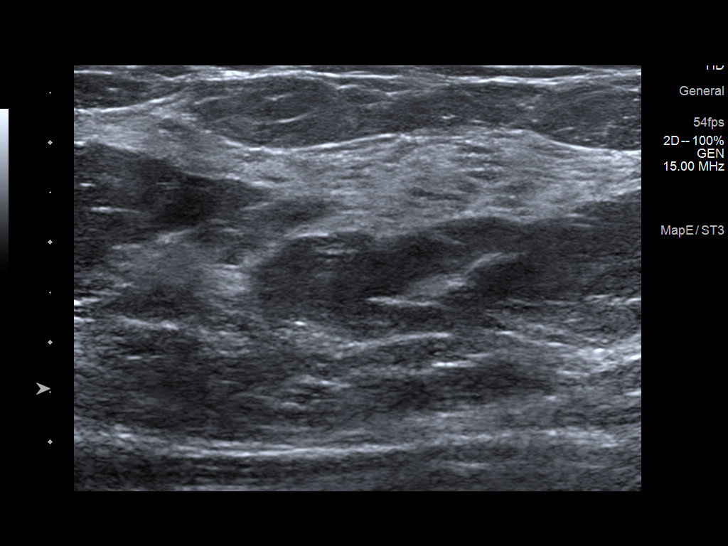
[im 4/12]
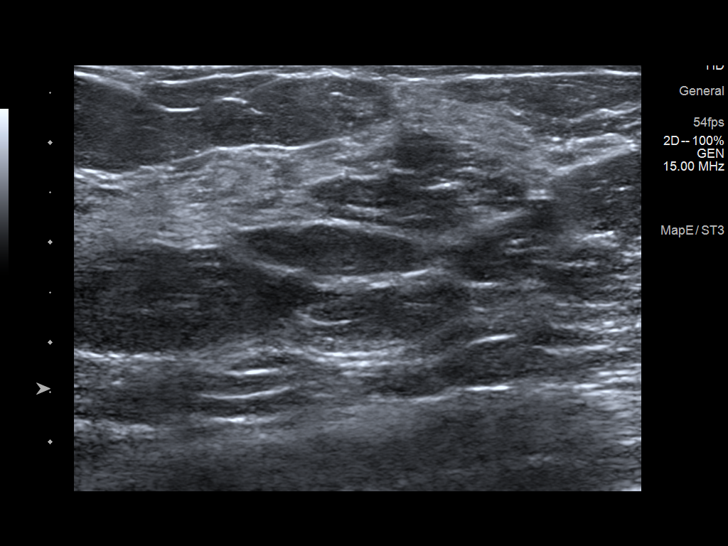
[im 5/12]
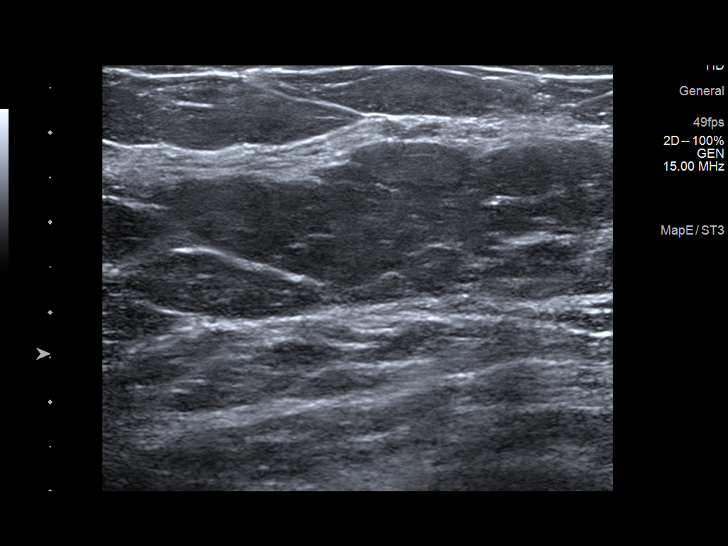
[im 6/12]
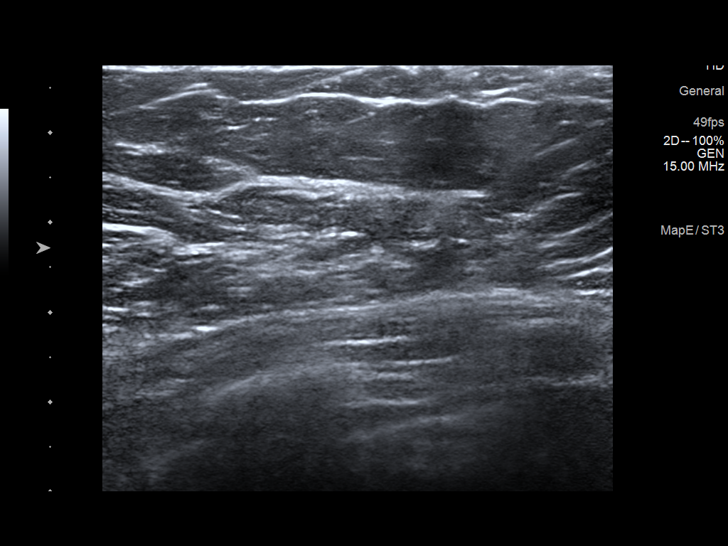
[im 7/12]
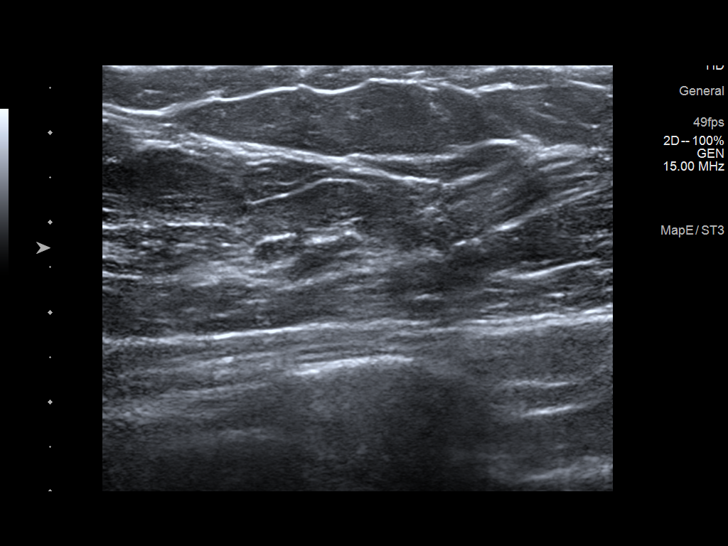
[im 8/12]
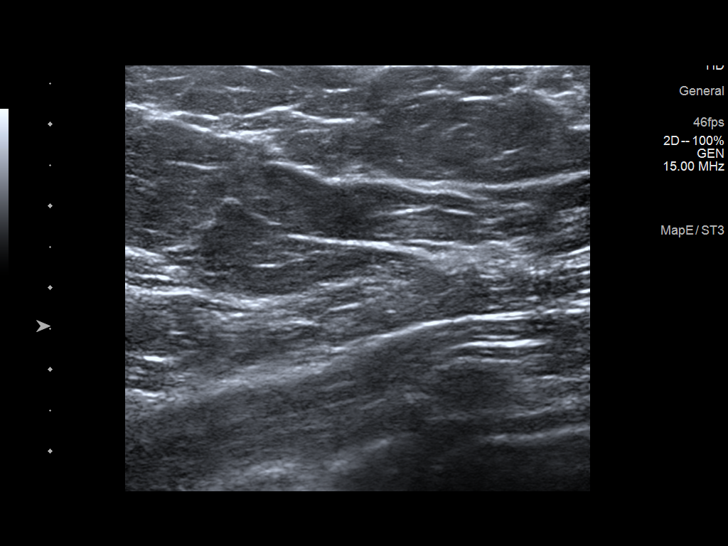
[im 9/12]
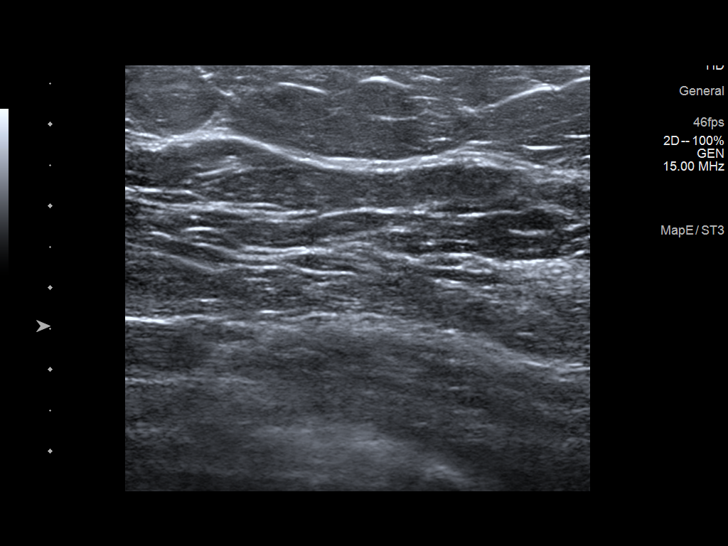
[im 10/12]
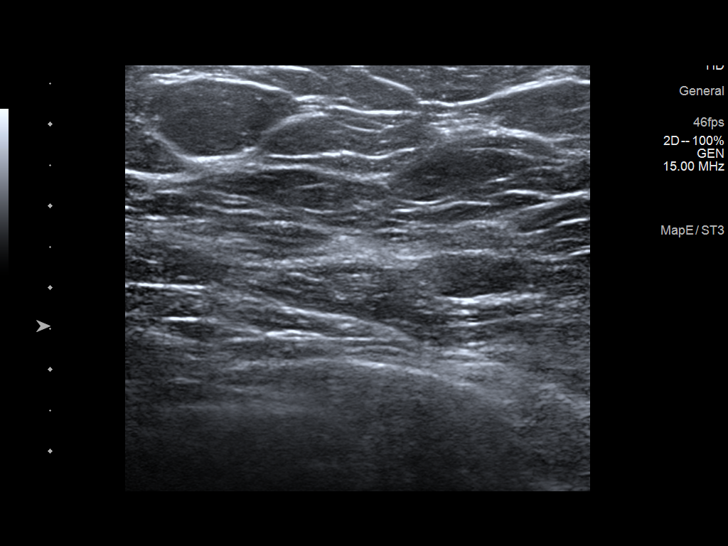
[im 11/12]
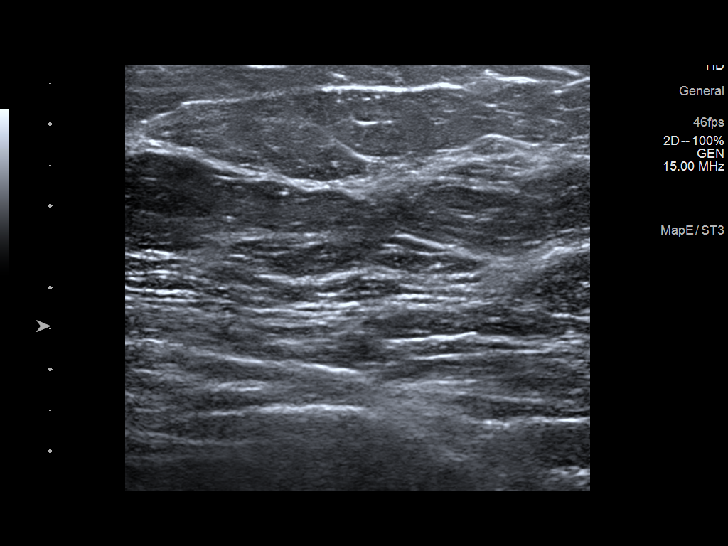
[im 12/12]
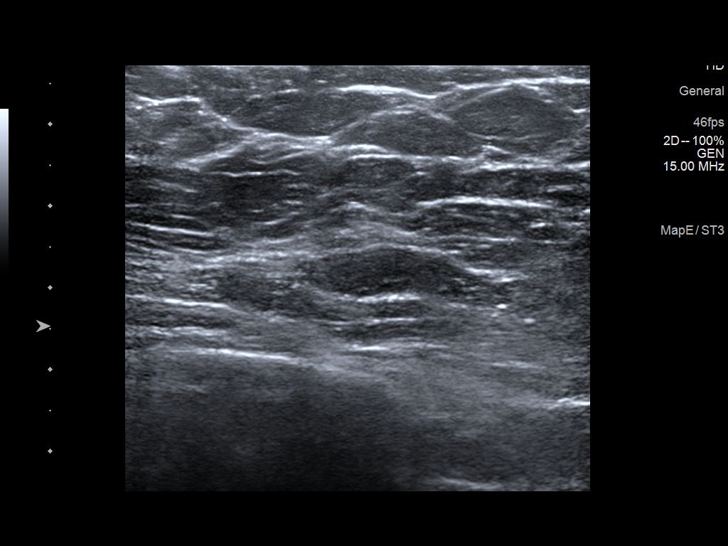

[12 of 12 positions shown; findings below may reference images not displayed]

ACR Breast Density Category b: There are scattered areas of
fibroglandular density.
FINDINGS: Spot compression CC and MLO views of the left breast are submitted.
The previously questioned asymmetries of the left breast are less
well seen on additional views.

Targeted ultrasound is performed, showing no focal abnormal discrete
cystic or solid lesion in the upper outer quadrant left breast,
central left breast, lower inner quadrant left breast.
IMPRESSION: Probable benign findings.

RECOMMENDATION:
Six-month follow-up mammogram left breast.

I have discussed the findings and recommendations with the patient.
If applicable, a reminder letter will be sent to the patient
regarding the next appointment.

BI-RADS CATEGORY  3: Probably benign.

## 2022-04-27 IMAGING — MG MM DIGITAL DIAGNOSTIC UNILAT*L* W/ TOMO W/ CAD
4 series · 4 of 12 positions shown · non-contrast
Comparison: Prior films

CLINICAL DATA: Callback from screening mammogram for possible
asymmetry left breast

EXAM:
DIGITAL DIAGNOSTIC UNILATERAL LEFT MAMMOGRAM WITH TOMOSYNTHESIS AND
CAD; ULTRASOUND LEFT BREAST LIMITED
TECHNIQUE: Left digital diagnostic mammography and breast tomosynthesis was
performed. The images were evaluated with computer-aided detection.;
Targeted ultrasound examination of the left breast was performed.

[L CC synth-2D]
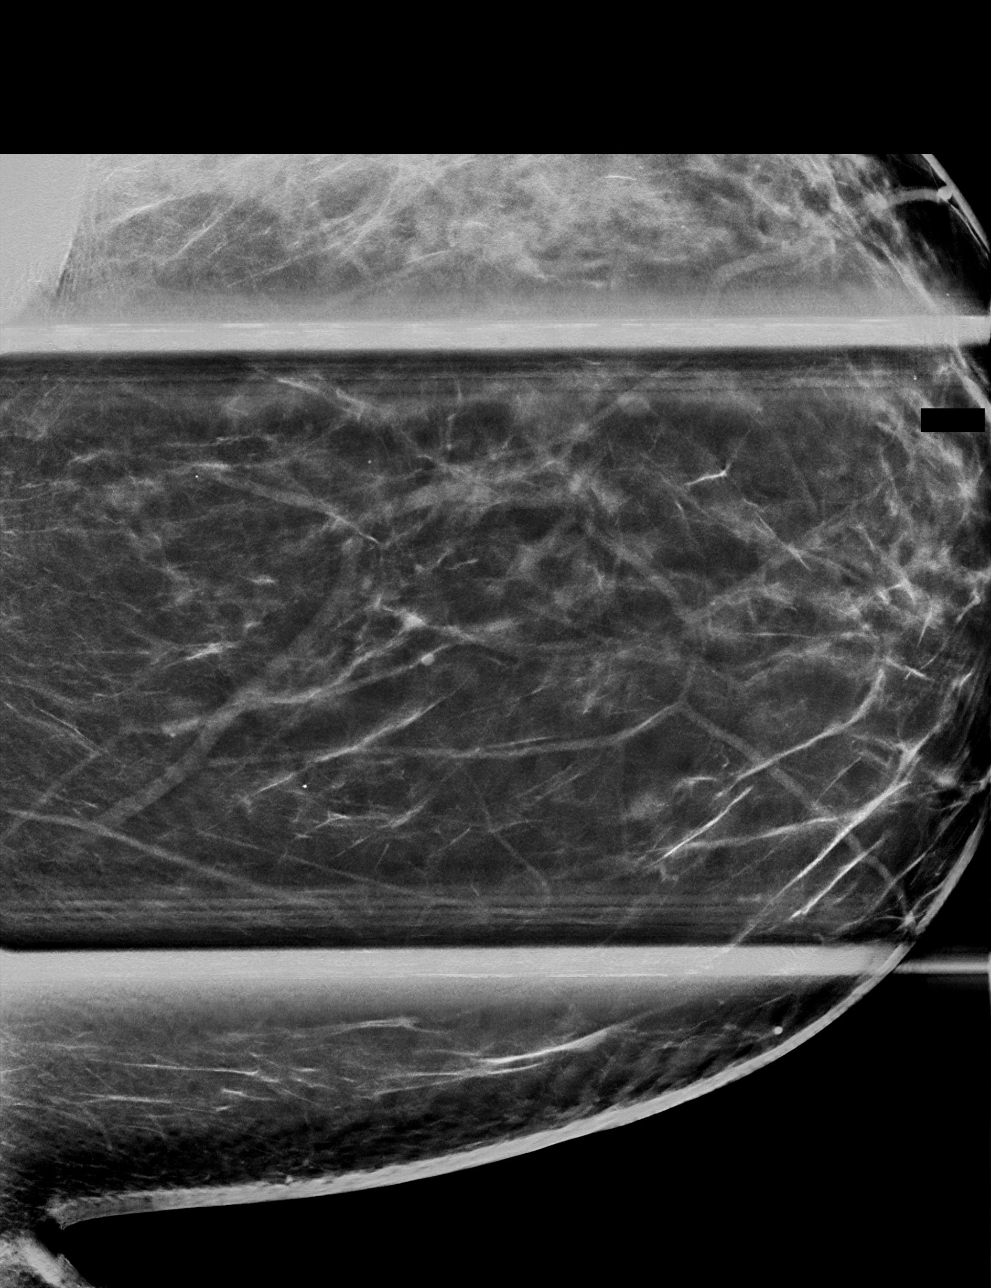

[L MLO synth-2D]
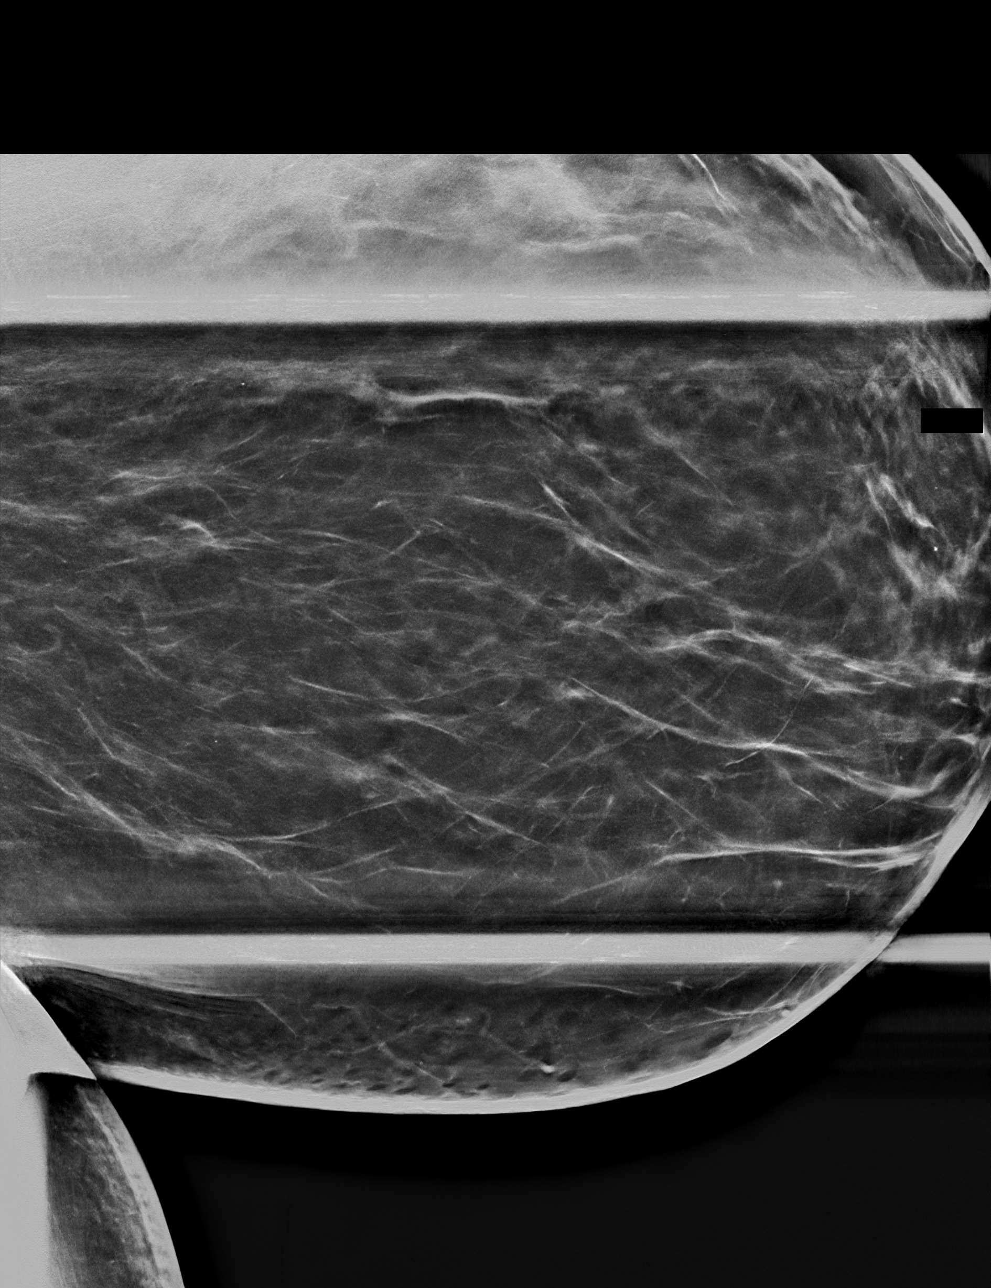

[L MLO tomo · tomo slice 49/96.0]
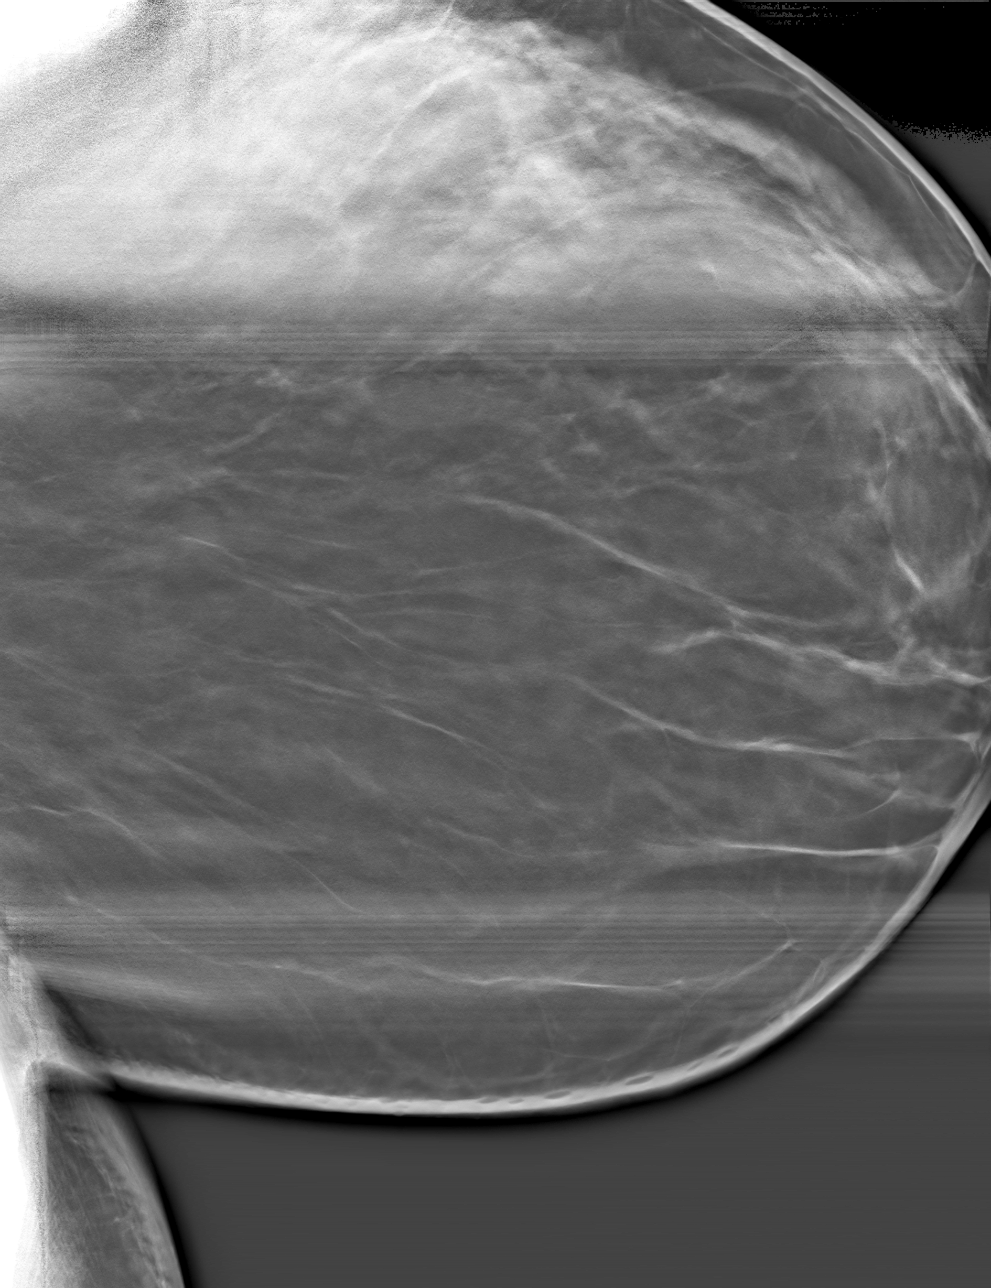

[L CC tomo · tomo slice 43/85.0]
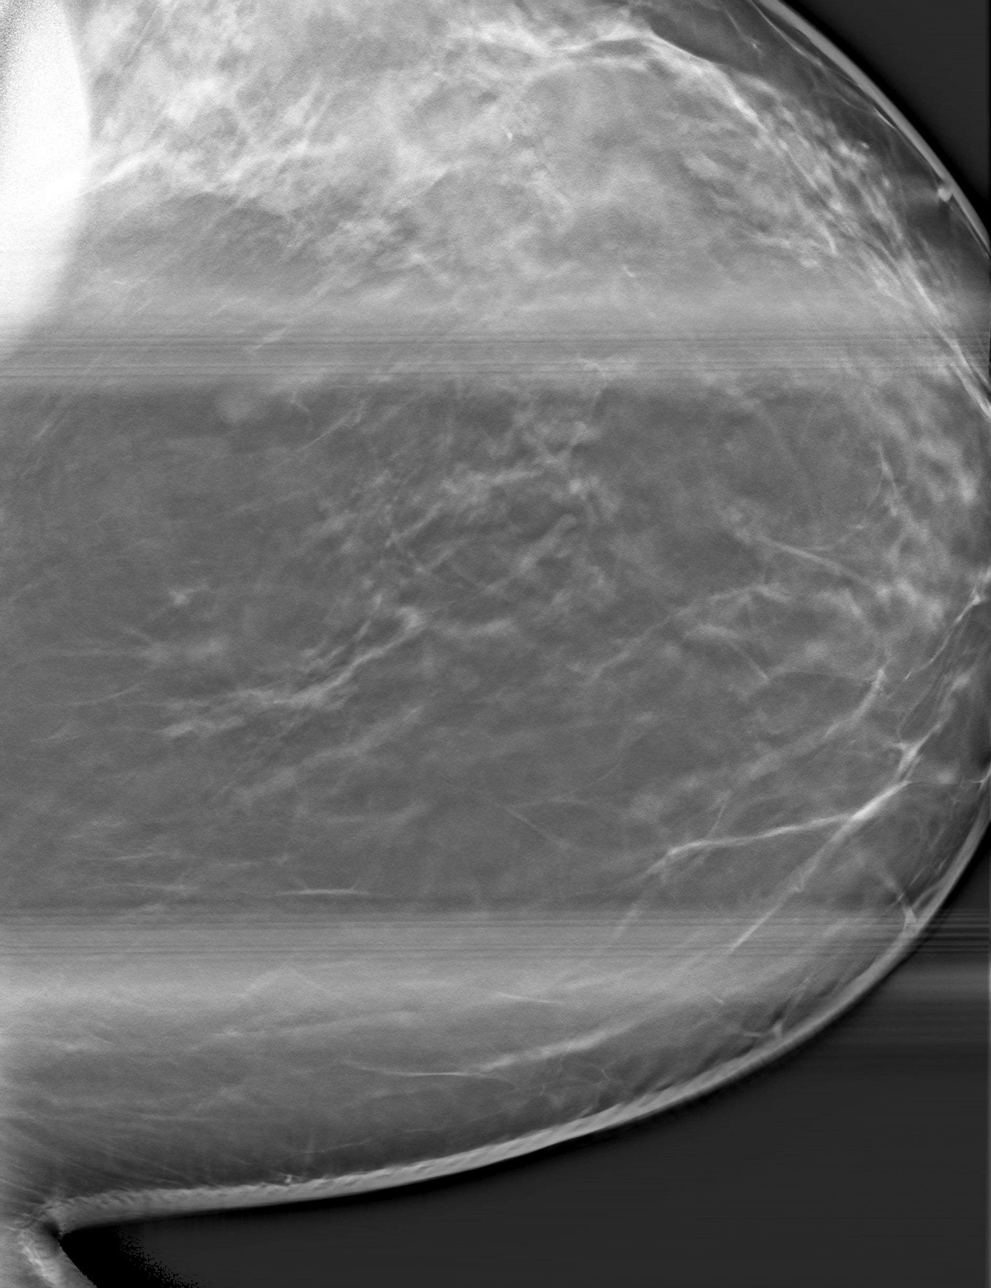

[4 of 12 positions shown; findings below may reference images not displayed]

ACR Breast Density Category b: There are scattered areas of
fibroglandular density.
FINDINGS: Spot compression CC and MLO views of the left breast are submitted.
The previously questioned asymmetries of the left breast are less
well seen on additional views.

Targeted ultrasound is performed, showing no focal abnormal discrete
cystic or solid lesion in the upper outer quadrant left breast,
central left breast, lower inner quadrant left breast.
IMPRESSION: Probable benign findings.

RECOMMENDATION:
Six-month follow-up mammogram left breast.

I have discussed the findings and recommendations with the patient.
If applicable, a reminder letter will be sent to the patient
regarding the next appointment.

BI-RADS CATEGORY  3: Probably benign.

## 2022-05-06 ENCOUNTER — Ambulatory Visit (HOSPITAL_COMMUNITY)
Admission: EM | Admit: 2022-05-06 | Discharge: 2022-05-06 | Disposition: A | Discharge status: Home / Self Care | Payer: BC Managed Care – PPO | Attending: Physician Assistant | Admitting: Physician Assistant

## 2022-05-06 ENCOUNTER — Ambulatory Visit (INDEPENDENT_AMBULATORY_CARE_PROVIDER_SITE_OTHER): Payer: BC Managed Care – PPO

## 2022-05-06 ENCOUNTER — Encounter (HOSPITAL_COMMUNITY): Payer: Self-pay

## 2022-05-06 DIAGNOSIS — M1711 Unilateral primary osteoarthritis, right knee: Secondary | ICD-10-CM | POA: Diagnosis not present

## 2022-05-06 DIAGNOSIS — M25561 Pain in right knee: Secondary | ICD-10-CM

## 2022-05-06 DIAGNOSIS — M79609 Pain in unspecified limb: Secondary | ICD-10-CM

## 2022-05-06 MED ORDER — IBUPROFEN 800 MG PO TABS: 800.0000 mg | ORAL_TABLET | Freq: Three times a day (TID) | ORAL | 0 refills | Status: AC

## 2022-05-06 NOTE — ED Triage Notes (Signed)
Pt is here for pain in the right leg x 3days

## 2022-05-06 NOTE — Discharge Instructions (Addendum)
Your x-ray was normal.  Please go get ultrasound tomorrow.  Use ibuprofen for pain.  Do not take NSAIDs with this medication including aspirin, ibuprofen/Advil, naproxen/Aleve.  Keep your knee elevated and use ice, compression, brace.  Follow-up with orthopedist if your symptoms are not improving quickly.  If anything worsens or changes please return for reevaluation.

## 2022-05-06 NOTE — ED Provider Notes (Signed)
[facility]    CSN: [id number] Arrival date & time: 05/06/22  1407      History   Chief Complaint Chief Complaint  Patient presents with   Leg Pain    HPI April Roberts is a 41 y.o. female.   Patient presents today with 3-day history of worsening right knee and leg pain.  Reports that pain is minimal at rest but if she attempts to ambulate "it shoots off the chart".  She has tried ibuprofen 800 mg with improvement of symptoms.  She denies history of blood clot including DVT or PE.  Denies any recent immobilization, surgical procedure, recent COVID diagnosis, exogenous hormone use, active malignancy.  Denies any shortness of breath, chest pain, palpitations.  Denies any known injury or increase in activity prior to symptom onset.  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  She is unable to ambulate as result of the pain.  Denies previous surgery involving her knee.    History reviewed. No pertinent past medical history.  There are no problems to display for this patient.   Past Surgical History:  Procedure Laterality Date   labial cyst      OB History   No obstetric history on file.      Home Medications    Prior to Admission medications   Medication Sig Start Date End Date Taking? Authorizing Provider  ibuprofen (ADVIL) 800 MG tablet Take 1 tablet (800 mg total) by mouth 3 (three) times daily. 05/06/22   Lopez Dentinger, Denny Peon K, PA-C  penicillin v potassium (VEETID) 500 MG tablet Take 1 tablet (500 mg total) by mouth 3 (three) times daily. Patient not taking: Reported on 10/14/2016 08/25/15   Renne Crigler, PA-C    Family History Family History  Problem Relation Age of Onset   Breast cancer Neg Hx     Social History Social History   Tobacco Use   Smoking status: Never   Smokeless tobacco: Never  Substance Use Topics   Alcohol use: No   Drug use: No     Allergies   Patient  has no known allergies.   Review of Systems Review of Systems  Constitutional:  Positive for activity change. Negative for appetite change, fatigue and fever.  Respiratory:  Negative for shortness of breath.   Cardiovascular:  Negative for chest pain and palpitations.  Gastrointestinal:  Negative for abdominal pain, diarrhea, nausea and vomiting.  Musculoskeletal:  Positive for arthralgias, gait problem and myalgias. Negative for joint swelling.  Neurological:  Negative for weakness and numbness.     Physical Exam Triage Vital Signs ED Triage Vitals  Enc Vitals Group     BP 05/06/22 1726 (!) 143/98     Pulse Rate 05/06/22 1726 86     Resp 05/06/22 1726 12     Temp 05/06/22 1726 98.5 F (36.9 C)     Temp Source 05/06/22 1726 Oral     SpO2 05/06/22 1726 98 %     Weight --      Height --      Head Circumference --      Peak Flow --      Pain Score 05/06/22 1725 10     Pain Loc --      Pain Edu? --      Excl. in GC? --    No data found.  Updated Vital Signs BP (!) 143/98 (BP Location: Right Arm)  Pulse 86   Temp 98.5 F (36.9 C) (Oral)   Resp 12   LMP 04/18/2022   SpO2 98%   Visual Acuity Right Eye Distance:   Left Eye Distance:   Bilateral Distance:    Right Eye Near:   Left Eye Near:    Bilateral Near:     Physical Exam Vitals reviewed.  Constitutional:      General: She is awake. She is not in acute distress.    Appearance: Normal appearance. She is well-developed. She is not ill-appearing.     Comments: Very pleasant female appears stated age in no acute distress sitting comfortably in exam room in wheelchair  HENT:     Head: Normocephalic and atraumatic.  Cardiovascular:     Rate and Rhythm: Normal rate and regular rhythm.     Heart sounds: Normal heart sounds, S1 normal and S2 normal. No murmur heard.    Comments: Negative Denna Haggard' sign on right Pulmonary:     Effort: Pulmonary effort is normal.     Breath sounds: Normal breath sounds. No  wheezing, rhonchi or rales.     Comments: Clear to auscultation bilaterally Musculoskeletal:     Right knee: No swelling. Decreased range of motion. Tenderness present. No LCL laxity, MCL laxity, ACL laxity or PCL laxity.     Right lower leg: No tenderness or bony tenderness. No edema.     Left lower leg: No edema.     Comments: Right knee: No pain with palpation.  Mild tenderness in popliteal fossa.  No cords noted.  Decreased range of motion with extension and flexion secondary to pain.  No ligamentous laxity on exam.  Psychiatric:        Behavior: Behavior is cooperative.      UC Treatments / Results  Labs (all labs ordered are listed, but only abnormal results are displayed) Labs Reviewed - No data to display  EKG   Radiology DG Knee Complete 4 Views Right  Result Date: 05/06/2022 CLINICAL DATA:  pain and trouble ambulating EXAM: RIGHT KNEE - COMPLETE 4+ VIEW COMPARISON:  X-ray right knee 01/26/2021 FINDINGS: No evidence of fracture, dislocation, or joint effusion. At least moderate tricompartmental degenerative changes of the right knee. Findings most prominent at the medial tibiofemoral compartment. Soft tissues are unremarkable. IMPRESSION: No acute displaced fracture or dislocation. At least moderate tricompartmental degenerative changes. Electronically Signed   By: Tish Frederickson M.D.   On: 05/06/2022 18:15    Procedures Procedures (including critical care time)  Medications Ordered in UC Medications - No data to display  Initial Impression / Assessment and Plan / UC Course  I have reviewed the triage vital signs and the nursing notes.  Pertinent labs & imaging results that were available during my care of the patient were reviewed by me and considered in my medical decision making (see chart for details).     Patient is well-appearing, afebrile, nontoxic, nontachycardic.  X-ray was obtained that showed no acute abnormality but did show degenerative changes.  I  suspect symptoms are related to a Baker's cyst though this is not palpable on exam.  Will obtain ultrasound study to rule out blood clot as etiology of symptoms.  Patient will go have this done tomorrow but if she develops any worsening symptoms including chest pain, shortness of breath, palpitations, increasing pain she is to go to the emergency room immediately.  She was encouraged to use conservative treatment measures including RICE protocol and start ibuprofen 800 mg.  Discussed  that she is not to take NSAIDs with this medication due to risk of GI bleeding.  She is established with an orthopedic provider encouraged to call them to schedule appointment if symptoms do not resolve quickly.  If she has any worsening or changing symptoms she is to be seen immediately.  Strict return precautions given.  Work excuse note provided.  Final Clinical Impressions(s) / UC Diagnoses   Final diagnoses:  Acute pain of right knee  Popliteal pain     Discharge Instructions      Your x-ray was normal.  Please go get ultrasound tomorrow.  Use ibuprofen for pain.  Do not take NSAIDs with this medication including aspirin, ibuprofen/Advil, naproxen/Aleve.  Keep your knee elevated and use ice, compression, brace.  Follow-up with orthopedist if your symptoms are not improving quickly.  If anything worsens or changes please return for reevaluation.     ED Prescriptions     Medication Sig Dispense Auth. Provider   ibuprofen (ADVIL) 800 MG tablet Take 1 tablet (800 mg total) by mouth 3 (three) times daily. 21 tablet Kaya Pottenger, Noberto Retort, PA-C      PDMP not reviewed this encounter.   Jeani Hawking, PA-C 05/06/22 [id number]

## 2022-05-07 ENCOUNTER — Ambulatory Visit (HOSPITAL_COMMUNITY): Admission: RE | Admit: 2022-05-07 | Payer: BC Managed Care – PPO | Source: Ambulatory Visit

## 2022-05-09 DIAGNOSIS — M7989 Other specified soft tissue disorders: Secondary | ICD-10-CM | POA: Diagnosis not present

## 2022-05-09 DIAGNOSIS — M179 Osteoarthritis of knee, unspecified: Secondary | ICD-10-CM | POA: Diagnosis not present

## 2022-05-10 DIAGNOSIS — R600 Localized edema: Secondary | ICD-10-CM | POA: Diagnosis not present

## 2022-05-10 DIAGNOSIS — M7989 Other specified soft tissue disorders: Secondary | ICD-10-CM | POA: Diagnosis not present

## 2022-06-15 DIAGNOSIS — M1711 Unilateral primary osteoarthritis, right knee: Secondary | ICD-10-CM | POA: Diagnosis not present

## 2022-06-15 DIAGNOSIS — M170 Bilateral primary osteoarthritis of knee: Secondary | ICD-10-CM | POA: Diagnosis not present

## 2022-06-15 DIAGNOSIS — M1712 Unilateral primary osteoarthritis, left knee: Secondary | ICD-10-CM | POA: Diagnosis not present

## 2022-07-03 DIAGNOSIS — M1990 Unspecified osteoarthritis, unspecified site: Secondary | ICD-10-CM | POA: Diagnosis not present

## 2022-07-03 DIAGNOSIS — Z6841 Body Mass Index (BMI) 40.0 and over, adult: Secondary | ICD-10-CM | POA: Diagnosis not present

## 2022-08-01 DIAGNOSIS — Z6841 Body Mass Index (BMI) 40.0 and over, adult: Secondary | ICD-10-CM | POA: Diagnosis not present

## 2022-08-01 DIAGNOSIS — K219 Gastro-esophageal reflux disease without esophagitis: Secondary | ICD-10-CM | POA: Diagnosis not present

## 2022-08-01 DIAGNOSIS — Z0000 Encounter for general adult medical examination without abnormal findings: Secondary | ICD-10-CM | POA: Diagnosis not present

## 2022-08-01 DIAGNOSIS — Z13220 Encounter for screening for lipoid disorders: Secondary | ICD-10-CM | POA: Diagnosis not present

## 2022-08-01 DIAGNOSIS — R5383 Other fatigue: Secondary | ICD-10-CM | POA: Diagnosis not present

## 2022-08-01 DIAGNOSIS — R739 Hyperglycemia, unspecified: Secondary | ICD-10-CM | POA: Diagnosis not present

## 2022-08-07 ENCOUNTER — Other Ambulatory Visit (HOSPITAL_COMMUNITY)
Admission: RE | Admit: 2022-08-07 | Discharge: 2022-08-07 | Disposition: A | Discharge status: Home / Self Care | Payer: BC Managed Care – PPO | Source: Ambulatory Visit | Attending: Nurse Practitioner | Admitting: Nurse Practitioner

## 2022-08-07 DIAGNOSIS — Z124 Encounter for screening for malignant neoplasm of cervix: Secondary | ICD-10-CM | POA: Insufficient documentation

## 2022-08-07 DIAGNOSIS — Z113 Encounter for screening for infections with a predominantly sexual mode of transmission: Secondary | ICD-10-CM | POA: Diagnosis not present

## 2022-08-07 DIAGNOSIS — Z01419 Encounter for gynecological examination (general) (routine) without abnormal findings: Secondary | ICD-10-CM | POA: Diagnosis not present

## 2022-08-08 LAB — CYTOLOGY - PAP
Comment: NEGATIVE
Diagnosis: NEGATIVE
High risk HPV: NEGATIVE

## 2022-09-01 DIAGNOSIS — N920 Excessive and frequent menstruation with regular cycle: Secondary | ICD-10-CM | POA: Diagnosis not present

## 2022-09-01 DIAGNOSIS — D250 Submucous leiomyoma of uterus: Secondary | ICD-10-CM | POA: Diagnosis not present

## 2022-09-01 DIAGNOSIS — D509 Iron deficiency anemia, unspecified: Secondary | ICD-10-CM | POA: Diagnosis not present

## 2022-09-25 DIAGNOSIS — R7989 Other specified abnormal findings of blood chemistry: Secondary | ICD-10-CM | POA: Diagnosis not present

## 2022-09-25 DIAGNOSIS — E611 Iron deficiency: Secondary | ICD-10-CM | POA: Diagnosis not present

## 2022-09-25 DIAGNOSIS — E559 Vitamin D deficiency, unspecified: Secondary | ICD-10-CM | POA: Diagnosis not present

## 2022-10-05 DIAGNOSIS — Z30433 Encounter for removal and reinsertion of intrauterine contraceptive device: Secondary | ICD-10-CM | POA: Diagnosis not present

## 2022-10-05 DIAGNOSIS — Z3202 Encounter for pregnancy test, result negative: Secondary | ICD-10-CM | POA: Diagnosis not present

## 2023-02-07 ENCOUNTER — Other Ambulatory Visit: Payer: Self-pay | Admitting: Internal Medicine

## 2023-02-07 DIAGNOSIS — N6489 Other specified disorders of breast: Secondary | ICD-10-CM

## 2023-02-21 ENCOUNTER — Inpatient Hospital Stay
Admission: RE | Admit: 2023-02-21 | Discharge: 2023-02-21 | Discharge status: Home / Self Care | Payer: BC Managed Care – PPO | Source: Ambulatory Visit | Attending: Internal Medicine | Admitting: Internal Medicine

## 2023-02-21 DIAGNOSIS — N6489 Other specified disorders of breast: Secondary | ICD-10-CM

## 2023-02-21 DIAGNOSIS — R928 Other abnormal and inconclusive findings on diagnostic imaging of breast: Secondary | ICD-10-CM | POA: Diagnosis not present

## 2023-05-14 DIAGNOSIS — T8332XA Displacement of intrauterine contraceptive device, initial encounter: Secondary | ICD-10-CM | POA: Diagnosis not present

## 2023-05-14 DIAGNOSIS — Z7251 High risk heterosexual behavior: Secondary | ICD-10-CM | POA: Diagnosis not present

## 2023-05-14 DIAGNOSIS — N764 Abscess of vulva: Secondary | ICD-10-CM | POA: Diagnosis not present

## 2023-05-14 DIAGNOSIS — N898 Other specified noninflammatory disorders of vagina: Secondary | ICD-10-CM | POA: Diagnosis not present

## 2023-05-14 DIAGNOSIS — R102 Pelvic and perineal pain: Secondary | ICD-10-CM | POA: Diagnosis not present

## 2023-05-21 ENCOUNTER — Ambulatory Visit
Admission: RE | Admit: 2023-05-21 | Discharge: 2023-05-21 | Disposition: A | Discharge status: Home / Self Care | Payer: BC Managed Care – PPO | Source: Ambulatory Visit | Attending: Nurse Practitioner | Admitting: Nurse Practitioner

## 2023-05-21 ENCOUNTER — Other Ambulatory Visit: Payer: Self-pay | Admitting: Nurse Practitioner

## 2023-05-21 DIAGNOSIS — N764 Abscess of vulva: Secondary | ICD-10-CM | POA: Diagnosis not present

## 2023-05-21 DIAGNOSIS — T8332XA Displacement of intrauterine contraceptive device, initial encounter: Secondary | ICD-10-CM

## 2023-05-21 DIAGNOSIS — R102 Pelvic and perineal pain: Secondary | ICD-10-CM | POA: Diagnosis not present

## 2023-05-21 DIAGNOSIS — D259 Leiomyoma of uterus, unspecified: Secondary | ICD-10-CM | POA: Diagnosis not present

## 2023-08-07 DIAGNOSIS — G4719 Other hypersomnia: Secondary | ICD-10-CM | POA: Diagnosis not present

## 2023-08-07 DIAGNOSIS — Z23 Encounter for immunization: Secondary | ICD-10-CM | POA: Diagnosis not present

## 2023-08-07 DIAGNOSIS — E669 Obesity, unspecified: Secondary | ICD-10-CM | POA: Diagnosis not present

## 2023-08-07 DIAGNOSIS — D649 Anemia, unspecified: Secondary | ICD-10-CM | POA: Diagnosis not present

## 2023-08-07 DIAGNOSIS — F5101 Primary insomnia: Secondary | ICD-10-CM | POA: Diagnosis not present

## 2023-08-07 DIAGNOSIS — K219 Gastro-esophageal reflux disease without esophagitis: Secondary | ICD-10-CM | POA: Diagnosis not present

## 2023-08-07 DIAGNOSIS — Z0000 Encounter for general adult medical examination without abnormal findings: Secondary | ICD-10-CM | POA: Diagnosis not present

## 2023-08-07 DIAGNOSIS — M170 Bilateral primary osteoarthritis of knee: Secondary | ICD-10-CM | POA: Diagnosis not present

## 2023-08-09 ENCOUNTER — Inpatient Hospital Stay: Admitting: Oncology

## 2023-08-09 ENCOUNTER — Telehealth: Payer: Self-pay

## 2023-08-09 ENCOUNTER — Inpatient Hospital Stay

## 2023-08-09 NOTE — Telephone Encounter (Signed)
 Responded to VM message from patient who requested to have today's appt rescheduled. Sent secure chat to Trecia Rogers, who agreed to reach out and try to reschedule patient. Also left a VM message on patient cell phone to expect a scheduler to call for new appt.

## 2023-08-14 DIAGNOSIS — M170 Bilateral primary osteoarthritis of knee: Secondary | ICD-10-CM | POA: Diagnosis not present

## 2023-08-14 DIAGNOSIS — M1712 Unilateral primary osteoarthritis, left knee: Secondary | ICD-10-CM | POA: Diagnosis not present

## 2023-08-14 DIAGNOSIS — M1711 Unilateral primary osteoarthritis, right knee: Secondary | ICD-10-CM | POA: Diagnosis not present

## 2023-08-23 DIAGNOSIS — D509 Iron deficiency anemia, unspecified: Secondary | ICD-10-CM | POA: Diagnosis not present

## 2023-08-23 DIAGNOSIS — N920 Excessive and frequent menstruation with regular cycle: Secondary | ICD-10-CM | POA: Diagnosis not present

## 2023-09-24 DIAGNOSIS — Z30011 Encounter for initial prescription of contraceptive pills: Secondary | ICD-10-CM | POA: Diagnosis not present

## 2023-09-24 DIAGNOSIS — Z7251 High risk heterosexual behavior: Secondary | ICD-10-CM | POA: Diagnosis not present

## 2023-09-24 DIAGNOSIS — D219 Benign neoplasm of connective and other soft tissue, unspecified: Secondary | ICD-10-CM | POA: Diagnosis not present

## 2023-09-24 DIAGNOSIS — N920 Excessive and frequent menstruation with regular cycle: Secondary | ICD-10-CM | POA: Diagnosis not present

## 2023-09-24 DIAGNOSIS — D500 Iron deficiency anemia secondary to blood loss (chronic): Secondary | ICD-10-CM | POA: Diagnosis not present

## 2023-09-24 DIAGNOSIS — Z01419 Encounter for gynecological examination (general) (routine) without abnormal findings: Secondary | ICD-10-CM | POA: Diagnosis not present

## 2023-11-02 DIAGNOSIS — D219 Benign neoplasm of connective and other soft tissue, unspecified: Secondary | ICD-10-CM | POA: Diagnosis not present

## 2023-11-02 DIAGNOSIS — D500 Iron deficiency anemia secondary to blood loss (chronic): Secondary | ICD-10-CM | POA: Diagnosis not present

## 2023-11-02 DIAGNOSIS — N920 Excessive and frequent menstruation with regular cycle: Secondary | ICD-10-CM | POA: Diagnosis not present

## 2023-12-17 ENCOUNTER — Emergency Department (HOSPITAL_BASED_OUTPATIENT_CLINIC_OR_DEPARTMENT_OTHER)

## 2023-12-17 ENCOUNTER — Emergency Department (HOSPITAL_BASED_OUTPATIENT_CLINIC_OR_DEPARTMENT_OTHER)
Admission: EM | Admit: 2023-12-17 | Discharge: 2023-12-17 | Disposition: A | Discharge status: Home / Self Care | Attending: Emergency Medicine | Admitting: Emergency Medicine

## 2023-12-17 ENCOUNTER — Other Ambulatory Visit: Payer: Self-pay

## 2023-12-17 ENCOUNTER — Encounter (HOSPITAL_BASED_OUTPATIENT_CLINIC_OR_DEPARTMENT_OTHER): Payer: Self-pay

## 2023-12-17 DIAGNOSIS — R0602 Shortness of breath: Secondary | ICD-10-CM | POA: Diagnosis not present

## 2023-12-17 DIAGNOSIS — R002 Palpitations: Secondary | ICD-10-CM | POA: Diagnosis not present

## 2023-12-17 DIAGNOSIS — D509 Iron deficiency anemia, unspecified: Secondary | ICD-10-CM | POA: Insufficient documentation

## 2023-12-17 DIAGNOSIS — R55 Syncope and collapse: Secondary | ICD-10-CM | POA: Diagnosis not present

## 2023-12-17 LAB — BASIC METABOLIC PANEL WITH GFR
Anion gap: 12 (ref 5–15)
BUN: 10 mg/dL (ref 6–20)
CO2: 21 mmol/L — ABNORMAL LOW (ref 22–32)
Calcium: 9.1 mg/dL (ref 8.9–10.3)
Chloride: 101 mmol/L (ref 98–111)
Creatinine, Ser: 1 mg/dL (ref 0.44–1.00)
GFR, Estimated: >60 mL/min (ref >60)
Glucose, Bld: 101 mg/dL — ABNORMAL HIGH (ref 70–99)
Potassium: 4.3 mmol/L (ref 3.5–5.1)
Sodium: 134 mmol/L — ABNORMAL LOW (ref 135–145)

## 2023-12-17 LAB — CBC
HCT: 25.8 % — ABNORMAL LOW (ref 36.0–46.0)
Hemoglobin: 7.2 g/dL — ABNORMAL LOW (ref 12.0–15.0)
MCH: 18.2 pg — ABNORMAL LOW (ref 26.0–34.0)
MCHC: 27.9 g/dL — ABNORMAL LOW (ref 30.0–36.0)
MCV: 65.3 fL — ABNORMAL LOW (ref 80.0–100.0)
Platelets: 428 K/uL — ABNORMAL HIGH (ref 150–400)
RBC: 3.95 MIL/uL (ref 3.87–5.11)
RDW: 18.9 % — ABNORMAL HIGH (ref 11.5–15.5)
WBC: 5.5 K/uL (ref 4.0–10.5)
nRBC: 0 % (ref 0.0–0.2)

## 2023-12-17 LAB — PRO BRAIN NATRIURETIC PEPTIDE: Pro Brain Natriuretic Peptide: 69.6 pg/mL (ref <300.0)

## 2023-12-17 LAB — RESP PANEL BY RT-PCR (RSV, FLU A&B, COVID)  RVPGX2
Influenza A by PCR: NEGATIVE
Influenza B by PCR: NEGATIVE
Resp Syncytial Virus by PCR: NEGATIVE
SARS Coronavirus 2 by RT PCR: NEGATIVE

## 2023-12-17 LAB — TROPONIN T, HIGH SENSITIVITY: Troponin T High Sensitivity: <15 ng/L (ref <19)

## 2023-12-17 LAB — MAGNESIUM: Magnesium: 2.1 mg/dL (ref 1.7–2.4)

## 2023-12-17 LAB — T4, FREE: Free T4: 0.95 ng/dL (ref 0.61–1.12)

## 2023-12-17 LAB — TSH: TSH: 2.63 u[IU]/mL (ref 0.350–4.500)

## 2023-12-17 LAB — PREGNANCY, URINE: Preg Test, Ur: NEGATIVE

## 2023-12-17 MED ORDER — LORAZEPAM 1 MG PO TABS
1.0000 mg | ORAL_TABLET | Freq: Once | ORAL | Status: AC
  Administered 2023-12-17 (×2): 1 mg via ORAL
  Filled 2023-12-17: qty 1

## 2023-12-17 NOTE — ED Provider Notes (Signed)
 [place] [facility] Provider Note   CSN: [id number] Arrival date & time: 12/17/23  9295     Patient presents with: Shortness of Breath   April Roberts is a 43 y.o. female.    Shortness of Breath Associated symptoms: no chest pain      43 year old female with medical history significant for uterine fibroids and anemia who presents to the emergency department with lightheadedness, heart palpitations and near syncope.  The patient states that this has happened before but previously she passed out.  She denies any history of cardiac arrhythmia, SVT.  She states that this morning she developed sudden onset heart palpitations, lightheadedness and near syncope.  Symptoms lasted for around 30 minutes and have mostly resolved.  She denies any history of panic attacks.  She states that she feels slightly tremulous in the right hand.  She is not a daily alcohol user.  She denies any chest pain.  She feels that symptoms have mostly resolved.  She feels mildly anxious.  She denies any history of DVT or PE.  She is not on estrogen containing oral contraceptives.  She denies any lower extremity swelling or cramping.  She denies any recent long travel on plants or cars.  No recent surgery or hemoptysis.  Prior to Admission medications   Medication Sig Start Date End Date Taking? Authorizing Provider  ibuprofen  (ADVIL ) 800 MG tablet Take 1 tablet (800 mg total) by mouth 3 (three) times daily. 05/06/22   Raspet, Erin K, PA-C  penicillin  v potassium (VEETID) 500 MG tablet Take 1 tablet (500 mg total) by mouth 3 (three) times daily. Patient not taking: Reported on 10/14/2016 08/25/15   Geiple, Joshua, PA-C    Allergies: Patient has no known allergies.    Review of Systems  Respiratory:  Positive for shortness of breath.   Cardiovascular:  Positive for palpitations. Negative for chest pain.  Neurological:  Positive for light-headedness.  All other systems reviewed  and are negative.   Updated Vital Signs BP 121/81   Pulse 85   Temp 98.2 F (36.8 C)   Resp 20   Wt 101.2 kg   LMP 11/25/2023 (Approximate)   SpO2 100%   BMI 40.79 kg/m   Physical Exam Vitals and nursing note reviewed.  Constitutional:      General: She is not in acute distress.    Appearance: She is well-developed.  HENT:     Head: Normocephalic and atraumatic.  Eyes:     Conjunctiva/sclera: Conjunctivae normal.  Cardiovascular:     Rate and Rhythm: Normal rate and regular rhythm.  Pulmonary:     Effort: Pulmonary effort is normal. No respiratory distress.     Breath sounds: Normal breath sounds.  Abdominal:     Palpations: Abdomen is soft.     Tenderness: There is no abdominal tenderness.  Musculoskeletal:        General: No swelling.     Cervical back: Neck supple.     Right lower leg: No edema.     Left lower leg: No edema.  Skin:    General: Skin is warm and dry.     Capillary Refill: Capillary refill takes less than 2 seconds.  Neurological:     Mental Status: She is alert.     Comments: MENTAL STATUS EXAM:    Orientation: Alert and oriented to person, place and time.  Memory: Cooperative, follows commands well.  Language: Speech is clear and language is normal.  CRANIAL NERVES:    CN 2 (Optic): Visual fields intact to confrontation.  CN 3,4,6 (EOM): Pupils equal and reactive to light. Full extraocular eye movement without nystagmus.  CN 5 (Trigeminal): Facial sensation is normal, no weakness of masticatory muscles.  CN 7 (Facial): No facial weakness or asymmetry.  CN 8 (Auditory): Auditory acuity grossly normal.  CN 9,10 (Glossophar): The uvula is midline, the palate elevates symmetrically.  CN 11 (spinal access): Normal sternocleidomastoid and trapezius strength.  CN 12 (Hypoglossal): The tongue is midline. No atrophy or fasciculations.SABRA   MOTOR:  Muscle Strength: 5/5RUE, 5/5LUE, 5/5RLE, 5/5LLE.   COORDINATION:   Mild resting tremor noted of the  right hand.   SENSATION:   Intact to light touch all four extremities.  GAIT: Gait not assessed   Psychiatric:        Mood and Affect: Mood normal.     (all labs ordered are listed, but only abnormal results are displayed) Labs Reviewed  BASIC METABOLIC PANEL WITH GFR - Abnormal; Notable for the following components:      Result Value   Sodium 134 (*)    CO2 21 (*)    Glucose, Bld 101 (*)    All other components within normal limits  CBC - Abnormal; Notable for the following components:   Hemoglobin 7.2 (*)    HCT 25.8 (*)    MCV 65.3 (*)    MCH 18.2 (*)    MCHC 27.9 (*)    RDW 18.9 (*)    Platelets 428 (*)    All other components within normal limits  RESP PANEL BY RT-PCR (RSV, FLU A&B, COVID)  RVPGX2  PREGNANCY, URINE  MAGNESIUM  PRO BRAIN NATRIURETIC PEPTIDE  TSH  T4, FREE  TROPONIN T, HIGH SENSITIVITY    EKG: EKG Interpretation Date/Time:  Monday December 17 2023 07:40:45 EDT Ventricular Rate:  85 PR Interval:  112 QRS Duration:  83 QT Interval:  357 QTC Calculation: 425 R Axis:   8  Text Interpretation: Sinus rhythm No significant change since last tracing Confirmed by Jerrol Agent ([id number]) on 12/17/2023 7:42:39 AM  Radiology: DG Chest 2 View Result Date: 12/17/2023 EXAM: 2 VIEW(S) XRAY OF THE CHEST 12/17/2023 08:01:05 AM COMPARISON: PA and lateral radiographs of the chest dated 02/26/2018. CLINICAL HISTORY: SHOB. Table formatting from the original note was not included. Images from the original note were not included. Reports shortness of breath, shakiness, lightheaded and fatigue since this morning. Denies chest pain. No signs of respiratory distress in triage. FINDINGS: LUNGS AND PLEURA: No focal pulmonary opacity. No pulmonary edema. No pleural effusion. No pneumothorax. HEART AND MEDIASTINUM: No acute abnormality of the cardiac and mediastinal silhouettes. BONES AND SOFT TISSUES: No acute osseous abnormality. IMPRESSION: 1. No acute process. Electronically  signed by: evalene coho 12/17/2023 08:38 AM EDT RP Workstation: [id number]     Procedures   Medications Ordered in the ED  LORazepam  (ATIVAN ) tablet 1 mg (1 mg Oral Given 12/17/23 0800)    Clinical Course as of 12/17/23 0942  Mon Dec 17, 2023  0840 Hemoglobin(!): 7.2 [JL]  0840 MCV(!): 65.3 [JL]    Clinical Course User Index [JL] Jerrol Agent, MD                                 Medical Decision Making Amount and/or Complexity of Data Reviewed Labs: ordered. Decision-making details documented in ED Course. Radiology: ordered.  Risk Prescription drug management.  43 year old female with medical history significant for uterine fibroids and anemia who presents to the emergency department with lightheadedness, heart palpitations and near syncope.  The patient states that this has happened before but previously she passed out.  She denies any history of cardiac arrhythmia, SVT.  She states that this morning she developed sudden onset heart palpitations, lightheadedness and near syncope.  Symptoms lasted for around 30 minutes and have mostly resolved.  She denies any history of panic attacks.  She states that she feels slightly tremulous in the right hand.  She is not a daily alcohol user.  She denies any chest pain.  She feels that symptoms have mostly resolved.  She feels mildly anxious.  She denies any history of DVT or PE.  She is not on estrogen containing oral contraceptives.  She denies any lower extremity swelling or cramping.  She denies any recent long travel on plants or cars.  No recent surgery or hemoptysis.  Medical Decision Making:   April Roberts is a 43 y.o. female who presented to the ED today with a near syncopal episode detailed above with associated heart palpitations.  Complete initial physical exam performed, notably the patient  was CTAB, neuro intact, no LE swelling.    Reviewed and confirmed nursing documentation for past medical history, family  history, social history.    Initial Assessment:   With the patient's presentation of syncope, most likely diagnosis is orthostatic hypotension vs vasovagal episode. Other diagnoses were considered including (but not limited to) arrythmogenic syncope, valvular abnormality, PE, aortic dissection. These are considered less likely due to history of present illness and physical exam findings.   This is most consistent with an acute life/limb threatening illness complicated by underlying chronic conditions. In particular, concerning cardiac etiology, this is less likely to be the etiology given the lack of chest pain, lack of serious comorbidities including heart failure or CAD.   Additionally, patient's history appears more consistent with benign episodes including orthostatic hypotension or vagal episode. Considered SVT that resolved. Considered panic attack.   FAINT score can be utilized in syncope to predict cardiac event in the appropriate patient. Utilization of BNP/Troponin testing can predict cardiac events within 30 days.   This patient is a candidate for a faint score as she has a baseline low pretest probability for cardiac etiology. Will proceed with BNP/Troponin testing as below. If negative, then patient without risk factor for acute cardiac event causing her syncope.   Initial Plan:   Screening labs including CBC and Metabolic panel to evaluate for infectious or metabolic etiology of disease.  Urine pregnancy CXR to evaluate for structural/infectious intrathoracic pathology.  EKG to evaluate for cardiac pathology.  Pt is Wells low risk and PERC negative, low suspicion for PE Pt without chest pressure or heaviness to raise concern for ACS Objective evaluation as below reviewed after administration of IVF/Telemetry monitoring  Initial Study Results:   Laboratory  All laboratory results reviewed without evidence of clinically relevant pathology.   Exceptions include: Microcytic anemia to  7.2 noted with an MCV of 65.  BMP generally unremarkable, magnesium normal, urine pregnancy negative, COVID flu and RSV PCR negative, troponin and BNP normal  EKG EKG was reviewed independently. Rate, rhythm, axis, intervals all examined and without medically relevant abnormality. ST segments without concerns for elevations.    Radiology:  All images reviewed independently. Agree with radiology report at this time.   DG Chest 2 View Result Date: 12/17/2023 EXAM: 2 VIEW(S) XRAY  OF THE CHEST 12/17/2023 08:01:05 AM COMPARISON: PA and lateral radiographs of the chest dated 02/26/2018. CLINICAL HISTORY: SHOB. Table formatting from the original note was not included. Images from the original note were not included. Reports shortness of breath, shakiness, lightheaded and fatigue since this morning. Denies chest pain. No signs of respiratory distress in triage. FINDINGS: LUNGS AND PLEURA: No focal pulmonary opacity. No pulmonary edema. No pleural effusion. No pneumothorax. HEART AND MEDIASTINUM: No acute abnormality of the cardiac and mediastinal silhouettes. BONES AND SOFT TISSUES: No acute osseous abnormality. IMPRESSION: 1. No acute process. Electronically signed by: evalene coho 12/17/2023 08:38 AM EDT RP Workstation: [id number]        Final Assessment and Plan:   Patient is Wells low risk, PERC negative.  Orthostatics negative, EKG generally reassuring and chest x-ray unremarkable.  Labs revealed microcytic anemia to 7.2 noted with an MCV of 65.  Patient has a known history of iron deficiency anemia, has not tolerated oral iron supplementation.  She also has a history of uterine fibroids and heavy menstrual cycles, last 1 last month was heavy.  Likely mixed picture of microcytic iron deficiency anemia and chronic blood loss anemia from heavy menstrual cycles.  Patient denies any melena or hematochezia.  Her PCP has been discussing IV iron transfusions as she has not tolerated oral iron.  At this  time, no indication for blood transfusion, I did recommend close outpatient follow-up with her primary care provider for recheck of your hemoglobin in the next few days that she is close to the threshold for needing a blood transfusion.  Patient overall well-appearing, remainder of workup reassuring, stable for discharge and close outpatient follow-up.   Clinical Impression:  1. Microcytic anemia   2. Shortness of breath   3. Palpitations      Discharge       Final diagnoses:  Microcytic anemia  Shortness of breath  Palpitations    ED Discharge Orders     None          Jerrol Agent, MD 12/17/23 [phone]

## 2023-12-17 NOTE — ED Triage Notes (Signed)
 Reports shortness of breath, shakiness, lightheaded and fatigue since this morning.   Denies chest pain. No signs of respiratory distress in triage.

## 2023-12-17 NOTE — Discharge Instructions (Addendum)
 Your symptoms are likely due to anemia.  You have a history of iron deficiency anemia and uterine fibroids with heavy menstrual cycles.  Your hemoglobin today was 7.2 which is not low enough to require blood transfusion however it is close to that threshold of 7.  Please follow-up with your primary care provider for recheck of your hemoglobin in the next few days as a hemoglobin less than 7 would indicate a need for a blood transfusion.  You could benefit from outpatient iron transfusions as you have not tolerated oral iron supplementation.  In the setting of your palpitations you could also benefit from outpatient cardiac monitoring however it is likely that your symptoms are due to your anemia

## 2023-12-17 NOTE — ED Notes (Signed)
 Pt requested to use restroom prior to EKG and blood work
